# Patient Record
Sex: Male | Born: 1985
Health system: Southern US, Community
[De-identification: ages and names within clinical notes are randomized; demographics above are authoritative.]

## PROBLEM LIST (undated history)

## (undated) DIAGNOSIS — B009 Herpesviral infection, unspecified: Secondary | ICD-10-CM

## (undated) DIAGNOSIS — F319 Bipolar disorder, unspecified: Secondary | ICD-10-CM

## (undated) DIAGNOSIS — F419 Anxiety disorder, unspecified: Secondary | ICD-10-CM

## (undated) DIAGNOSIS — F1910 Other psychoactive substance abuse, uncomplicated: Secondary | ICD-10-CM

## (undated) DIAGNOSIS — F1410 Cocaine abuse, uncomplicated: Secondary | ICD-10-CM

---
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
## 2009-03-07 ENCOUNTER — Emergency Department (HOSPITAL_COMMUNITY): Admission: EM | Admit: 2009-03-07 | Discharge: 2009-03-07 | Payer: Self-pay | Admitting: Emergency Medicine

## 2009-11-03 ENCOUNTER — Emergency Department (HOSPITAL_COMMUNITY): Admission: EM | Admit: 2009-11-03 | Discharge: 2009-11-03 | Payer: Self-pay | Admitting: Emergency Medicine

## 2010-04-05 ENCOUNTER — Emergency Department (HOSPITAL_COMMUNITY)
Admission: EM | Admit: 2010-04-05 | Discharge: 2010-04-05 | Payer: Self-pay | Source: Home / Self Care | Admitting: Emergency Medicine

## 2010-06-17 LAB — BASIC METABOLIC PANEL
CO2: 24 mEq/L (ref 19–32)
Calcium: 9.9 mg/dL (ref 8.4–10.5)
Creatinine, Ser: 1.29 mg/dL (ref 0.4–1.5)
GFR calc Af Amer: >60 mL/min (ref >60)
GFR calc non Af Amer: >60 mL/min (ref >60)

## 2010-06-17 LAB — DIFFERENTIAL
Basophils Relative: 0 % (ref 0–1)
Eosinophils Absolute: 0 10*3/uL (ref 0.0–0.7)
Lymphs Abs: 2.3 10*3/uL (ref 0.7–4.0)
Neutrophils Relative %: 70 % (ref 43–77)

## 2010-06-17 LAB — URINALYSIS, ROUTINE W REFLEX MICROSCOPIC
Glucose, UA: NEGATIVE mg/dL
Hgb urine dipstick: NEGATIVE
Ketones, ur: NEGATIVE mg/dL
Protein, ur: NEGATIVE mg/dL
Urobilinogen, UA: 0.2 mg/dL (ref 0.0–1.0)

## 2010-06-17 LAB — CBC
MCH: 29.9 pg (ref 26.0–34.0)
Platelets: 220 10*3/uL (ref 150–400)
RBC: 5.18 MIL/uL (ref 4.22–5.81)
WBC: 9.9 10*3/uL (ref 4.0–10.5)

## 2010-06-17 LAB — RAPID URINE DRUG SCREEN, HOSP PERFORMED
Amphetamines: NOT DETECTED
Benzodiazepines: NOT DETECTED
Opiates: NOT DETECTED
Tetrahydrocannabinol: NOT DETECTED

## 2010-06-17 LAB — ETHANOL: Alcohol, Ethyl (B): <5 mg/dL (ref 0–10)

## 2010-07-05 LAB — RAPID URINE DRUG SCREEN, HOSP PERFORMED
Amphetamines: NOT DETECTED
Barbiturates: NOT DETECTED
Benzodiazepines: NOT DETECTED
Opiates: NOT DETECTED

## 2010-07-05 LAB — DIFFERENTIAL
Basophils Relative: 0 % (ref 0–1)
Lymphocytes Relative: 16 % (ref 12–46)
Lymphs Abs: 1.6 10*3/uL (ref 0.7–4.0)
Monocytes Absolute: 0.3 10*3/uL (ref 0.1–1.0)
Monocytes Relative: 3 % (ref 3–12)
Neutro Abs: 8.1 10*3/uL — ABNORMAL HIGH (ref 1.7–7.7)
Neutrophils Relative %: 80 % — ABNORMAL HIGH (ref 43–77)

## 2010-07-05 LAB — COMPREHENSIVE METABOLIC PANEL
Albumin: 4.2 g/dL (ref 3.5–5.2)
Alkaline Phosphatase: 44 U/L (ref 39–117)
BUN: 9 mg/dL (ref 6–23)
Calcium: 9.1 mg/dL (ref 8.4–10.5)
Creatinine, Ser: 0.99 mg/dL (ref 0.4–1.5)
Glucose, Bld: 98 mg/dL (ref 70–99)
Potassium: 3.5 mEq/L (ref 3.5–5.1)
Total Protein: 7.6 g/dL (ref 6.0–8.3)

## 2010-07-05 LAB — CBC
HCT: 44.8 % (ref 39.0–52.0)
Hemoglobin: 14.6 g/dL (ref 13.0–17.0)
MCHC: 32.6 g/dL (ref 30.0–36.0)
MCV: 90.8 fL (ref 78.0–100.0)
Platelets: 218 10*3/uL (ref 150–400)
RDW: 12.7 % (ref 11.5–15.5)

## 2010-07-05 LAB — URINALYSIS, ROUTINE W REFLEX MICROSCOPIC
Glucose, UA: NEGATIVE mg/dL
Hgb urine dipstick: NEGATIVE
Protein, ur: NEGATIVE mg/dL
Specific Gravity, Urine: 1.017 (ref 1.005–1.030)
Urobilinogen, UA: 0.2 mg/dL (ref 0.0–1.0)

## 2010-07-05 LAB — ETHANOL: Alcohol, Ethyl (B): 148 mg/dL — ABNORMAL HIGH (ref 0–10)

## 2010-08-19 NOTE — Op Note (Signed)
    [name], Kyle Sweeney                          ACCOUNT NO.:  [id number]   MEDICAL RECORD NO.:  [id number]                   PATIENT TYPE:   LOCATION:                                       FACILITY:   PHYSICIAN:  Cindee Salt, MD                      DATE OF BIRTH:  [birth year]   DATE OF PROCEDURE:  01/02/2002  DATE OF DISCHARGE:                                 OPERATIVE REPORT   PREOPERATIVE DIAGNOSIS:  Intra-articular fracture, middle phalanx, distal  interphalangeal joint, right ring finger.   POSTOPERATIVE DIAGNOSIS:  Intra-articular fracture, middle phalanx, distal  interphalangeal joint, right ring finger.   OPERATION:  Closed reduction and percutaneous pinning, intra-articular  fracture of right ring finger, middle phalanx.   SURGEON:  Cindee Salt, M.D.   ASSISTANT:  Zigmund Gottron, R.N.   ANESTHESIA:  Axillary block.   ANESTHESIOLOGIST:  Burna Forts, M.D.   INDICATIONS FOR PROCEDURE:  The patient is a 25 year old male who suffered a  fracture of his right ring finger.  It was initially nondisplaced, and  removed his splint.  Has noted displacement.  He is admitted for a closed,  and possible open reduction.   DESCRIPTION OF PROCEDURE:  The patient is brought to the operating room  where an axillary block was carried out without difficulty.  He was prepped  and draped using Betadine scrub and solution with the right arm free in a  supine position.  The fracture was manipulated, reduced with a very minimal  step-off.  A 3.5 K-wire was then inserted into the articular condyle.  This  was used to manipulate the fracture fragment into an anatomical position.  This was then crossed into the second condyle, stabilizing the fracture.  A  second wire was passed from the T-condylar portion into the shaft of the  middle phalanx from the opposite side, stabilizing the fracture in AP and  lateral directions, which was confirmed with AP and lateral image  intensification.  The  pins were bent and cut short.  A sterile compressive  dressing and splint were applied.  The patient tolerated the procedure well and was taken to the recovery room  for observation, in satisfactory condition.   DISPOSITION:  He is discharged home, to return to the [facility]  [place] in one week, on Vicodin and Keflex.                                                Cindee Salt, MD   GK/MEDQ  D:  01/02/2002  T:  01/06/2002  Job:  [id number]

## 2013-12-23 ENCOUNTER — Emergency Department (HOSPITAL_COMMUNITY)
Admission: EM | Admit: 2013-12-23 | Discharge: 2013-12-23 | Disposition: A | Discharge status: Home / Self Care | Payer: Self-pay | Attending: Emergency Medicine | Admitting: Emergency Medicine

## 2013-12-23 ENCOUNTER — Encounter (HOSPITAL_COMMUNITY): Payer: Self-pay | Admitting: Emergency Medicine

## 2013-12-23 DIAGNOSIS — K439 Ventral hernia without obstruction or gangrene: Secondary | ICD-10-CM | POA: Insufficient documentation

## 2013-12-23 DIAGNOSIS — Z79899 Other long term (current) drug therapy: Secondary | ICD-10-CM | POA: Insufficient documentation

## 2013-12-23 DIAGNOSIS — K469 Unspecified abdominal hernia without obstruction or gangrene: Secondary | ICD-10-CM | POA: Insufficient documentation

## 2013-12-23 DIAGNOSIS — Z8619 Personal history of other infectious and parasitic diseases: Secondary | ICD-10-CM | POA: Insufficient documentation

## 2013-12-23 HISTORY — DX: Herpesviral infection, unspecified: B00.9

## 2013-12-23 MED ORDER — HYDROCODONE-ACETAMINOPHEN 5-325 MG PO TABS: 1.0000 | ORAL_TABLET | ORAL | Status: DC | PRN

## 2013-12-23 NOTE — ED Notes (Signed)
Per patient mid abdominal pain since last Friday-small "bulge" above umbilicus-hurts when he lifts things-things it might be a hernia

## 2013-12-23 NOTE — ED Notes (Signed)
Pt reports abd pain, has known ventral hernia.  Pt reports pain is where his hernia is.

## 2013-12-23 NOTE — ED Provider Notes (Signed)
CSN: [id number]     Arrival date & time 12/23/13  1515 History   First MD Initiated Contact with Patient 12/23/13 1516     Chief Complaint  Patient presents with  . Hernia      HPI  Patient presents to the area of pain and a palpable bulge in his midline abdomen. He works doing Orthoptist. About a week ago he felt some pain in the area. He is noticed a small lump that comes and goes. Several fingerbreadths above his umbilicus. No nausea no vomiting. No previous incisions in this area.  Past Medical History  Diagnosis Date  . Herpes    History reviewed. No pertinent past surgical history. No family history on file. History  Substance Use Topics  . Smoking status: Never Smoker   . Smokeless tobacco: Not on file  . Alcohol Use: No    Review of Systems  Constitutional: Negative for fever, chills, diaphoresis, appetite change and fatigue.  HENT: Negative for mouth sores, sore throat and trouble swallowing.   Eyes: Negative for visual disturbance.  Respiratory: Negative for cough, chest tightness, shortness of breath and wheezing.   Cardiovascular: Negative for chest pain.  Gastrointestinal: Positive for abdominal pain. Negative for nausea, vomiting, diarrhea and abdominal distention.  Endocrine: Negative for polydipsia, polyphagia and polyuria.  Genitourinary: Negative for dysuria, frequency and hematuria.  Musculoskeletal: Negative for gait problem.  Skin: Negative for color change, pallor and rash.  Neurological: Negative for dizziness, syncope, light-headedness and headaches.  Hematological: Does not bruise/bleed easily.  Psychiatric/Behavioral: Negative for behavioral problems and confusion.      Allergies  Review of patient's allergies indicates no known allergies.  Home Medications   Prior to Admission medications   Medication Sig Start Date End Date Taking? Authorizing Provider  valACYclovir (VALTREX) 500 MG tablet Take 500 mg by mouth daily.   Yes Historical  Provider, MD  HYDROcodone-acetaminophen (NORCO/VICODIN) 5-325 MG per tablet Take 1 tablet by mouth every 4 (four) hours as needed. 12/23/13   Rolland Porter, MD   BP 150/81  Pulse 78  Temp(Src) 98.8 F (37.1 C) (Oral)  Resp 18  SpO2 100% Physical Exam  Constitutional: He is oriented to person, place, and time. He appears well-developed and well-nourished. No distress.  HENT:  Head: Normocephalic.  Eyes: Conjunctivae are normal. Pupils are equal, round, and reactive to light. No scleral icterus.  Neck: Normal range of motion. Neck supple. No thyromegaly present.  Cardiovascular: Normal rate and regular rhythm.  Exam reveals no gallop and no friction rub.   No murmur heard. Pulmonary/Chest: Effort normal and breath sounds normal. No respiratory distress. He has no wheezes. He has no rales.  Abdominal: Soft. Bowel sounds are normal. He exhibits no distension. There is no tenderness. There is no rebound.    Musculoskeletal: Normal range of motion.  Neurological: He is alert and oriented to person, place, and time.  Skin: Skin is warm and dry. No rash noted.  Psychiatric: He has a normal mood and affect. His behavior is normal.    ED Course  Procedures (including critical care time) Labs Review Labs Reviewed - No data to display  Imaging Review No results found.   EKG Interpretation None      MDM   Final diagnoses:  Ventral hernia without obstruction or gangrene    Return precautions, signs of obstruction/incarceration discussed. I encouraged use of supportive belt around her insight was lifting. Given the name and information for [facility] to call and  discuss possible elective repair.    Rolland Porter, MD 12/23/13 [phone]

## 2013-12-23 NOTE — Progress Notes (Signed)
[facility] Community Liaison Stacy,  ° °Provided pt with a list of primary care resources and a [facility] Card application to help patient establish primary care.  °

## 2013-12-23 NOTE — Discharge Instructions (Signed)
Use an elastic support belt when lifting. Call [facility] for an office appointment to discuss surgical repair.   Ventral Hernia A ventral hernia (also called an incisional hernia) is a hernia that occurs at the site of a previous surgical cut (incision) in the abdomen. The abdominal wall spans from your lower chest down to your pelvis. If the abdominal wall is weakened from a surgical incision, a hernia can occur. A hernia is a bulge of bowel or muscle tissue pushing out on the weakened part of the abdominal wall. Ventral hernias can get bigger from straining or lifting. Obese and older people are at higher risk for a ventral hernia. People who develop infections after surgery or require repeat incisions at the same site on the abdomen are also at increased risk.  Sometimes, a hernia can develop in an area of the abdomen without a prior incision. CAUSES  A ventral hernia occurs because of weakness in the abdominal wall.  SYMPTOMS  Common symptoms include:  A visible bulge or lump on the abdominal wall.  Pain or tenderness around the lump.  Increased discomfort if you cough or make a sudden movement. If the hernia has blocked part of the intestine, a serious complication can occur (incarcerated or strangulated hernia). This can become a problem that requires emergency surgery because the blood flow to the blocked intestine may be cut off. Symptoms may include:  Feeling sick to your stomach (nauseous).  Throwing up (vomiting).  Stomach swelling (distention) or bloating.  Fever.  Rapid heartbeat. DIAGNOSIS  Your health care provider will take a medical history and perform a physical exam. Various tests may be ordered, such as:  Blood tests.  Urine tests.  Ultrasonography.  X-rays.  Computed tomography (CT). TREATMENT  Watchful waiting may be all that is needed for a smaller hernia that does not cause symptoms. Your health care provider may recommend the use of a  supportive belt (truss) that helps to keep the abdominal wall intact. For larger hernias or those that cause pain, surgery to repair the hernia is usually recommended. If a hernia becomes strangulated, emergency surgery needs to be done right away. HOME CARE INSTRUCTIONS  Avoid putting pressure or strain on the abdominal area.  Avoid heavy lifting.  Use good body positioning for physical tasks. Ask your health care provider about proper body positioning.  Use a supportive belt as directed by your health care provider.  Maintain a healthy weight.  Eat foods that are high in fiber, such as whole grains, fruits, and vegetables. Fiber helps prevent difficult bowel movements (constipation).  Drink enough fluids to keep your urine clear or pale yellow.  Follow up with your health care provider as directed. SEEK MEDICAL CARE IF:   Your hernia seems to be getting larger or more painful. SEEK IMMEDIATE MEDICAL CARE IF:   You have abdominal pain that is sudden and sharp.  Your pain becomes severe.  You have repeated vomiting.  You are sweating a lot.  You notice a rapid heartbeat.  You develop a fever. MAKE SURE YOU:   Understand these instructions.  Will watch your condition.  Will get help right away if you are not doing well or get worse. Document Released: 03/06/2012 Document Revised: 08/04/2013 Document Reviewed: 03/06/2012 [facility] Patient Information 2015 [place], [place]. This information is not intended to replace advice given to you by your health care provider. Make sure you discuss any questions you have with your health care provider.

## 2017-09-01 ENCOUNTER — Encounter (HOSPITAL_COMMUNITY): Payer: Self-pay | Admitting: Oncology

## 2017-09-01 ENCOUNTER — Emergency Department (HOSPITAL_COMMUNITY): Payer: No Typology Code available for payment source

## 2017-09-01 ENCOUNTER — Inpatient Hospital Stay (HOSPITAL_COMMUNITY)
Admission: EM | Admit: 2017-09-01 | Discharge: 2017-09-04 | DRG: 178 | Disposition: A | Discharge status: Home / Self Care | Payer: No Typology Code available for payment source | Attending: Family Medicine | Admitting: Family Medicine

## 2017-09-01 DIAGNOSIS — R0902 Hypoxemia: Secondary | ICD-10-CM | POA: Diagnosis present

## 2017-09-01 DIAGNOSIS — N179 Acute kidney failure, unspecified: Secondary | ICD-10-CM | POA: Diagnosis present

## 2017-09-01 DIAGNOSIS — F1910 Other psychoactive substance abuse, uncomplicated: Secondary | ICD-10-CM

## 2017-09-01 DIAGNOSIS — F1410 Cocaine abuse, uncomplicated: Secondary | ICD-10-CM | POA: Diagnosis present

## 2017-09-01 DIAGNOSIS — J690 Pneumonitis due to inhalation of food and vomit: Secondary | ICD-10-CM | POA: Diagnosis present

## 2017-09-01 DIAGNOSIS — E872 Acidosis: Secondary | ICD-10-CM | POA: Diagnosis present

## 2017-09-01 DIAGNOSIS — F3130 Bipolar disorder, current episode depressed, mild or moderate severity, unspecified: Secondary | ICD-10-CM | POA: Diagnosis present

## 2017-09-01 LAB — BASIC METABOLIC PANEL
ANION GAP: 22 — AB (ref 5–15)
BUN: 11 mg/dL (ref 6–20)
CHLORIDE: 99 mmol/L — AB (ref 101–111)
CO2: 22 mmol/L (ref 22–32)
CREATININE: 1.91 mg/dL — AB (ref 0.61–1.24)
Calcium: 9.5 mg/dL (ref 8.9–10.3)
GFR calc Af Amer: 52 mL/min — ABNORMAL LOW (ref >60)
GFR calc non Af Amer: 45 mL/min — ABNORMAL LOW (ref >60)
Glucose, Bld: 146 mg/dL — ABNORMAL HIGH (ref 65–99)
Potassium: 4.5 mmol/L (ref 3.5–5.1)
SODIUM: 143 mmol/L (ref 135–145)

## 2017-09-01 LAB — CBC
HEMATOCRIT: 51.3 % (ref 39.0–52.0)
HEMOGLOBIN: 16.5 g/dL (ref 13.0–17.0)
MCH: 28.6 pg (ref 26.0–34.0)
MCHC: 32.2 g/dL (ref 30.0–36.0)
MCV: 88.9 fL (ref 78.0–100.0)
Platelets: 276 10*3/uL (ref 150–400)
RBC: 5.77 MIL/uL (ref 4.22–5.81)
RDW: 12.4 % (ref 11.5–15.5)
WBC: 23.6 10*3/uL — AB (ref 4.0–10.5)

## 2017-09-01 LAB — I-STAT CG4 LACTIC ACID, ED: Lactic Acid, Venous: 4.54 mmol/L (ref 0.5–1.9)

## 2017-09-01 LAB — I-STAT TROPONIN, ED: TROPONIN I, POC: 0.06 ng/mL (ref 0.00–0.08)

## 2017-09-01 MED ORDER — CLINDAMYCIN PHOSPHATE 900 MG/50ML IV SOLN
900.0000 mg | Freq: Once | INTRAVENOUS | Status: AC
  Administered 2017-09-02: 900 mg via INTRAVENOUS
  Filled 2017-09-01: qty 50

## 2017-09-01 NOTE — ED Notes (Addendum)
Patient placed back on non-re breather due to drop in sats  to 82-83%; EDP notitifed-Monique,RN

## 2017-09-01 NOTE — ED Triage Notes (Signed)
Pt bib [facility] from home.  EMS called out when pt was found unresponsive after a well check.  Per EMS pt had pin point pupils, agonal breathing requiring EMS to bag pt.  Fire gave 2 mg IN narcan. Probable aspiration.  +emesis.  Pt given 6 mg of adenosine for a HR of 160-190's.  Pt reported using what he believed to be cocaine.  Pt A&O x 4 after narcan.

## 2017-09-01 NOTE — ED Notes (Signed)
EDP notifed of critical lactic acid-Monique,RN

## 2017-09-02 ENCOUNTER — Other Ambulatory Visit: Payer: Self-pay

## 2017-09-02 ENCOUNTER — Encounter (HOSPITAL_COMMUNITY): Payer: Self-pay | Admitting: *Deleted

## 2017-09-02 DIAGNOSIS — E872 Acidosis: Secondary | ICD-10-CM | POA: Diagnosis present

## 2017-09-02 DIAGNOSIS — J690 Pneumonitis due to inhalation of food and vomit: Secondary | ICD-10-CM | POA: Diagnosis present

## 2017-09-02 DIAGNOSIS — F3130 Bipolar disorder, current episode depressed, mild or moderate severity, unspecified: Secondary | ICD-10-CM | POA: Diagnosis present

## 2017-09-02 DIAGNOSIS — R0902 Hypoxemia: Secondary | ICD-10-CM

## 2017-09-02 DIAGNOSIS — F1910 Other psychoactive substance abuse, uncomplicated: Secondary | ICD-10-CM

## 2017-09-02 DIAGNOSIS — N179 Acute kidney failure, unspecified: Secondary | ICD-10-CM | POA: Diagnosis present

## 2017-09-02 DIAGNOSIS — F1410 Cocaine abuse, uncomplicated: Secondary | ICD-10-CM | POA: Diagnosis present

## 2017-09-02 LAB — I-STAT CG4 LACTIC ACID, ED: LACTIC ACID, VENOUS: 2.38 mmol/L — AB (ref 0.5–1.9)

## 2017-09-02 LAB — CK: CK TOTAL: 1272 U/L — AB (ref 49–397)

## 2017-09-02 LAB — CBC
HCT: 50.3 % (ref 39.0–52.0)
Hemoglobin: 16.2 g/dL (ref 13.0–17.0)
MCH: 28.7 pg (ref 26.0–34.0)
MCHC: 32.2 g/dL (ref 30.0–36.0)
MCV: 89.2 fL (ref 78.0–100.0)
PLATELETS: 221 10*3/uL (ref 150–400)
RBC: 5.64 MIL/uL (ref 4.22–5.81)
RDW: 12.2 % (ref 11.5–15.5)
WBC: 24.2 10*3/uL — AB (ref 4.0–10.5)

## 2017-09-02 LAB — BASIC METABOLIC PANEL
Anion gap: 15 (ref 5–15)
BUN: 14 mg/dL (ref 6–20)
CALCIUM: 9.2 mg/dL (ref 8.9–10.3)
CO2: 22 mmol/L (ref 22–32)
CREATININE: 1.42 mg/dL — AB (ref 0.61–1.24)
Chloride: 100 mmol/L — ABNORMAL LOW (ref 101–111)
GFR calc non Af Amer: >60 mL/min (ref >60)
Glucose, Bld: 93 mg/dL (ref 65–99)
Potassium: 4.3 mmol/L (ref 3.5–5.1)
Sodium: 137 mmol/L (ref 135–145)

## 2017-09-02 LAB — RAPID URINE DRUG SCREEN, HOSP PERFORMED
Amphetamines: NOT DETECTED
BENZODIAZEPINES: NOT DETECTED
Barbiturates: NOT DETECTED
COCAINE: POSITIVE — AB
Opiates: NOT DETECTED
Tetrahydrocannabinol: POSITIVE — AB

## 2017-09-02 LAB — MRSA PCR SCREENING: MRSA by PCR: NEGATIVE

## 2017-09-02 LAB — LACTIC ACID, PLASMA: Lactic Acid, Venous: 0.9 mmol/L (ref 0.5–1.9)

## 2017-09-02 LAB — HIV ANTIBODY (ROUTINE TESTING W REFLEX): HIV Screen 4th Generation wRfx: NONREACTIVE

## 2017-09-02 LAB — ETHANOL

## 2017-09-02 MED ORDER — LORAZEPAM 2 MG/ML IJ SOLN
1.0000 mg | INTRAMUSCULAR | Status: DC | PRN
  Administered 2017-09-02: 1 mg via INTRAVENOUS
  Filled 2017-09-02: qty 1

## 2017-09-02 MED ORDER — ONDANSETRON HCL 4 MG PO TABS: 4.0000 mg | ORAL_TABLET | Freq: Four times a day (QID) | ORAL | Status: DC | PRN

## 2017-09-02 MED ORDER — LORAZEPAM 2 MG/ML IJ SOLN: 1.0000 mg | Freq: Four times a day (QID) | INTRAMUSCULAR | Status: DC | PRN

## 2017-09-02 MED ORDER — THIAMINE HCL 100 MG/ML IJ SOLN: 100.0000 mg | Freq: Every day | INTRAMUSCULAR | Status: DC

## 2017-09-02 MED ORDER — FOLIC ACID 1 MG PO TABS
1.0000 mg | ORAL_TABLET | Freq: Every day | ORAL | Status: DC
Start: 2017-09-02 — End: 2017-09-04
  Administered 2017-09-02 – 2017-09-04 (×3): 1 mg via ORAL
  Filled 2017-09-02 (×3): qty 1

## 2017-09-02 MED ORDER — SODIUM CHLORIDE 0.9 % IV SOLN
INTRAVENOUS | Status: DC
  Administered 2017-09-02: 1000 mL via INTRAVENOUS
  Administered 2017-09-02 – 2017-09-04 (×4): via INTRAVENOUS

## 2017-09-02 MED ORDER — CLINDAMYCIN PHOSPHATE 900 MG/50ML IV SOLN
900.0000 mg | Freq: Three times a day (TID) | INTRAVENOUS | Status: DC
  Administered 2017-09-02 – 2017-09-03 (×4): 900 mg via INTRAVENOUS
  Filled 2017-09-02 (×5): qty 50

## 2017-09-02 MED ORDER — ACETAMINOPHEN 650 MG RE SUPP: 650.0000 mg | Freq: Four times a day (QID) | RECTAL | Status: DC | PRN

## 2017-09-02 MED ORDER — ACETAMINOPHEN 325 MG PO TABS
650.0000 mg | ORAL_TABLET | Freq: Four times a day (QID) | ORAL | Status: DC | PRN
  Administered 2017-09-02 – 2017-09-03 (×2): 650 mg via ORAL
  Filled 2017-09-02 (×2): qty 2

## 2017-09-02 MED ORDER — ADULT MULTIVITAMIN W/MINERALS CH
1.0000 | ORAL_TABLET | Freq: Every day | ORAL | Status: DC
  Administered 2017-09-02 – 2017-09-04 (×3): 1 via ORAL
  Filled 2017-09-02 (×3): qty 1

## 2017-09-02 MED ORDER — LORAZEPAM 1 MG PO TABS: 1.0000 mg | ORAL_TABLET | Freq: Four times a day (QID) | ORAL | Status: DC | PRN

## 2017-09-02 MED ORDER — ENOXAPARIN SODIUM 40 MG/0.4ML ~~LOC~~ SOLN
40.0000 mg | SUBCUTANEOUS | Status: DC
  Administered 2017-09-02 – 2017-09-03 (×2): 40 mg via SUBCUTANEOUS
  Filled 2017-09-02 (×2): qty 0.4

## 2017-09-02 MED ORDER — VITAMIN B-1 100 MG PO TABS
100.0000 mg | ORAL_TABLET | Freq: Every day | ORAL | Status: DC
  Administered 2017-09-02 – 2017-09-04 (×3): 100 mg via ORAL
  Filled 2017-09-02 (×4): qty 1

## 2017-09-02 MED ORDER — ONDANSETRON HCL 4 MG/2ML IJ SOLN: 4.0000 mg | Freq: Four times a day (QID) | INTRAMUSCULAR | Status: DC | PRN

## 2017-09-02 NOTE — ED Notes (Signed)
Patient and family requesting to be made XXX; Registration notifed-Monique,RN

## 2017-09-02 NOTE — ED Notes (Signed)
NT Kyle Sweeney currently drawing labs.

## 2017-09-02 NOTE — Progress Notes (Signed)
FPTS Interim Progress Note  S: Patient a bit sleepy this morning but reports that breathing has improved and is currently on [place]. Does not endorses much of an appetite.   O: BP 112/65   Pulse 95   Temp 99.2 F (37.3 C) (Oral)   Resp 15   Ht 5\' 10"  (1.778 m)   Wt 175 lb (79.4 kg)   SpO2 95%   BMI 25.11 kg/m     A/P: Patient appears to exhibit some signs of withdrawal this morning while our attending was rounding on him with noticeable shaking/tremors. Did not receive any calls about him overnight. Given history of substance use, will place him on CIWA protocol. Patient also received 1 mg ativan this morning. Will transfer patient to telemetry with low threshold to send them to SDU if symptoms worsens. He is currently receiving IVF and has been placed on [place] and maintaining appropriate O2 sat.  Lovena Neighboursiallo, Wynn Alldredge, MD 09/02/2017, 9:46 AM PGY-2, [facility] pager [phone]

## 2017-09-02 NOTE — ED Notes (Signed)
Patient's family member given recliner for comfort while waiting for room-Monique,RN

## 2017-09-02 NOTE — Progress Notes (Signed)
09/02/2017 Patient came from the emergency room to 2W20 at 1730. He is alert, orient and ambulatory. Patient feet dry, have tattoos on arms, back and chest. Patient receive MRSA swab, telemetry was place and central monitoring was notify [facility] Anwar Crill RN.

## 2017-09-02 NOTE — ED Notes (Addendum)
Pt arrived to F5 via stretcher. Pt alert, oriented, calm, cooperative. Significant other at bedside. O2 infusing at 2L/min via n/c. Monitor applied to pt. Lunch tray ordered for pt.

## 2017-09-02 NOTE — H&P (Signed)
Family Medicine Teaching [facility] Admission History and Physical Service Pager: [phone]  Patient name: Kyle Sweeney Medical record number: [id number] Date of birth: Apr 21, 1985 Age: 32 y.o. Gender: male  Primary Care Provider: [facility] Consultants: None Code Status: Full   Chief Complaint: AMS and SOB  Assessment and Plan: Kyle Sweeney is a 32 y.o. male with a past medical history significant for bipolar disorder who presents with shortness of breath in the setting of aspiration pneumonia secondary to substance ingestion ingestion requiring Narcan.  #Shortness of breath in the setting of aspiration pneumonitis, acute Patient presents with worsening shortness of breath after experiencing aspiration events in route to the ED.  On admission O2 sats in mid 80s with no improvement on nasal cannula.  Patient requiring nonrebreather mask to maintain oxygen saturation above 90%.  Chest x-ray showed extensive right lower lobe consolidation consistent with aspiration pneumonitis.  WBC elevated at 23.6, LA 4.54.  Patient started on clindamycin.  We will continue oxygen therapy as needed. --Admit to FMTS, admitting physician Dr.Eniola --Admit to MedSurg --Oxygen therapy, wean as tolerated --Continue clindamycin 900 mg every 8 --Follow-up on blood cultures --Follow-up on a.m. CBC and BMP --Acetaminophen 650 mg every 6 as needed --Zofran 4 mg every 6 as needed  #AKI Patient presented with creatinine of 1.91.  Likely secondary to hypoperfusion in the setting of unresponsiveness status post substance ingestion.  Unclear duration.  We will continue to monitor.  Expect improvement with gentle IV fluid.  Could also have an element of rhabdomyolysis. --Normal saline 125 cc/hr --Follow-up on a.m.BMP --Follow-up on CK  #Anion gap metabolic acidosis Patient presented with LA 4.54 with anion gap of 22.  Patient was found unresponsive at home requiring Narcan.   Unclear how long patient was down.  Likely elevated lactate secondary to decreased perfusion.  Could also be secondary to ingestion of unknown substance.  Patient does also have elevated WBC consistent with aspiration pneumonitis.  Bicarb is 22. --Gentle IV fluid, normal saline 125 cc/hr --Trend lactate --Follow-up on a.m. BMP  #Bipolar disorder Patient reports being on Paxil for the past year and Seroquel for the last 6 months for bipolar disorder.  [facility]  Unclear on Paxil dose.  Will hold Paxil and Seroquel until patient respiratory and mental status improved --Resume Paxil and Seroquel when clinically appropriate   FEN/GI: Regular diet Prophylaxis: Lovenox  Disposition: Home pending clinical improvement  History of Present Illness:  Kyle Sweeney is a 32 y.o. male past medical history significant for bipolar disorder who presents today with increased shortness of breath after he was found unresponsive at home in the setting of substance ingestion.  Patient reports that he just started a substance that he thought was cocaine.  Patient became unresponsive but is unclear of how long he was down.  EMS arrived and noted patient to have pinpoint pupils and unresponsive. Narcan was administered. Patient become responsive with some signs of tachycardia with heart rate in the 160s requiring adenosine.  Upon adenosine injection patient had an episode of emesis with aspiration.  Patient unable to maintain oxygen saturation above 80% despite nasal cannula. On admission to the ED patient was transitioned from nasal cannula to room with a mask.  Chest x-ray showed significant right lower lobe pneumonitis consistent with aspiration.  Patient had leukocytosis with WBC 23.6 as well as an elevated lactate 4.54 and anion gap 22..  Patient was started on clindamycin Family medicine teaching  service was consulted for admission.  Review Of Systems: Per HPI with the  following additions:  Review of Systems  Constitutional: Negative.   HENT: Negative.   Eyes: Negative.   Respiratory: Positive for shortness of breath.   Cardiovascular: Positive for palpitations.  Gastrointestinal: Negative.   Genitourinary: Negative.   Musculoskeletal: Negative.   Skin: Negative.   Neurological: Positive for loss of consciousness.  Endo/Heme/Allergies: Negative.   Psychiatric/Behavioral: Positive for substance abuse.    Patient Active Problem List   Diagnosis Date Noted  . Aspiration pneumonia of right middle lobe due to vomit (HCC)   . Hypoxia   . Polysubstance abuse [facility](HCC)     Past Medical History: Past Medical History:  Diagnosis Date  . Herpes     Past Surgical History: History reviewed. No pertinent surgical history.  Social History: Social History   Tobacco Use  . Smoking status: Never Smoker  Substance Use Topics  . Alcohol use: No  . Drug use: No   Additional social history:  Please also refer to relevant sections of EMR.  Family History: No family history on file. (If not completed, MUST add something in)  Allergies and Medications: No Known Allergies No current facility-administered medications on file prior to encounter.    Current Outpatient Medications on File Prior to Encounter  Medication Sig Dispense Refill  . PARoxetine HCl (PAXIL PO) Take by mouth daily.    . QUEtiapine (SEROQUEL) 50 MG tablet Take 50 mg by mouth every morning.   3  . valACYclovir (VALTREX) 500 MG tablet Take 500 mg by mouth daily.      Objective: BP 106/66   Pulse 100   Resp 19   SpO2 91%  Physical Exam: General: NAD,, able to participate in exam HEENT: Pinpoint pupils, able to track Cardiac: RRR, normal heart sounds, no murmurs. 2+ radial and PT pulses bilaterally Respiratory: Decreased breath sounds right middle lobe, No wheezes, rales or rhonchi Abdomen: soft, nontender, nondistended, no hepatic or splenomegaly, +BS Extremities: no edema or  cyanosis. WWP. Skin: warm and dry, no rashes noted Neuro: alert and oriented x4, no focal deficits Psych: Normal affect and mood  Labs and Imaging: CBC BMET  Recent Labs  Lab 09/01/17 2344  WBC 23.6*  HGB 16.5  HCT 51.3  PLT 276   Recent Labs  Lab 09/01/17 2230  NA 143  K 4.5  CL 99*  CO2 22  BUN 11  CREATININE 1.91*  GLUCOSE 146*  CALCIUM 9.5     Troponin:0.06 CXR: IMPRESSION: 1. Extensive right lower lobe consolidation, likely reflective of aspiration pneumonia/pneumonitis. Electronically Signed   By: Trudie Reedaniel  Entrikin M.D.   On: 09/01/2017 22:50   Lovena Neighboursiallo, Asiya Cutbirth, MD 09/02/2017, 12:54 AM PGY-2, [facility] FPTS Intern pager: [phone], text pages welcome

## 2017-09-02 NOTE — ED Notes (Signed)
Admitting at bedside-Monique,RN  

## 2017-09-02 NOTE — ED Provider Notes (Signed)
[facility] EMERGENCY DEPARTMENT Provider Note   CSN: [id number] Arrival date & time: 09/01/17  2211     History   Chief Complaint Chief Complaint  Patient presents with  . Ingestion    HPI Kyle Sweeney is a 32 y.o. male.  32 yo M with a chief complaint of a drug ingestion.  Patient thinks that he was doing cocaine and then woke up with EMS standing over him.  Patient does not provide much further history.  Per EMS the patient was in a fast heart rhythm that they thought might of been SVT was given  Adenosine with what looks more like atrial fibrillation.  This then converted to sinus tachycardia.  He also had an aspiration event.  The history is provided by the patient.  Ingestion  This is a new problem. The current episode started 1 to 2 hours ago. The problem occurs constantly. The problem has not changed since onset.Associated symptoms include shortness of breath. Pertinent negatives include no chest pain, no abdominal pain and no headaches. Nothing aggravates the symptoms. Nothing relieves the symptoms. He has tried nothing for the symptoms. The treatment provided no relief.    Past Medical History:  Diagnosis Date  . Herpes     There are no active problems to display for this patient.   History reviewed. No pertinent surgical history.      Home Medications    Prior to Admission medications   Medication Sig Start Date End Date Taking? Authorizing Provider  PARoxetine HCl (PAXIL PO) Take by mouth daily.   Yes [provider]  QUEtiapine (SEROQUEL) 50 MG tablet Take 50 mg by mouth every morning.  08/01/17  Yes [provider]  valACYclovir (VALTREX) 500 MG tablet Take 500 mg by mouth daily.   Yes [provider]    Family History No family history on file.  Social History Social History   Tobacco Use  . Smoking status: Never Smoker  Substance Use Topics  . Alcohol use: No  . Drug use: No     Allergies     Patient has no known allergies.   Review of Systems Review of Systems  Constitutional: Negative for chills and fever.  HENT: Negative for congestion and facial swelling.   Eyes: Negative for discharge and visual disturbance.  Respiratory: Positive for shortness of breath.   Cardiovascular: Negative for chest pain and palpitations.  Gastrointestinal: Negative for abdominal pain, diarrhea and vomiting.  Musculoskeletal: Negative for arthralgias and myalgias.  Skin: Negative for color change and rash.  Neurological: Negative for tremors, syncope and headaches.  Psychiatric/Behavioral: Negative for confusion and dysphoric mood.     Physical Exam Updated Vital Signs BP 118/85   Pulse (!) 107   Resp 17   SpO2 95%   Physical Exam  Constitutional: He is oriented to person, place, and time. He appears well-developed and well-nourished.  HENT:  Head: Normocephalic and atraumatic.  Eyes: Pupils are equal, round, and reactive to light. EOM are normal.  Neck: Normal range of motion. Neck supple. No JVD present.  Cardiovascular: Regular rhythm. Tachycardia present. Exam reveals no gallop and no friction rub.  No murmur heard. Pulmonary/Chest: No respiratory distress. He has no wheezes.  Abdominal: He exhibits no distension and no mass. There is no tenderness. There is no rebound and no guarding.  Musculoskeletal: Normal range of motion.  Neurological: He is alert and oriented to person, place, and time.  Skin: No rash noted. No pallor.  Psychiatric: He has a normal mood and affect. His behavior is normal.  Nursing note and vitals reviewed.    ED Treatments / Results  Labs (all labs ordered are listed, but only abnormal results are displayed) Labs Reviewed  BASIC METABOLIC PANEL - Abnormal; Notable for the following components:      Result Value   Chloride 99 (*)    Glucose, Bld 146 (*)    Creatinine, Ser 1.91 (*)    GFR calc non Af Amer 45 (*)    GFR calc Af Amer 52 (*)     Anion gap 22 (*)    All other components within normal limits  CBC - Abnormal; Notable for the following components:   WBC 23.6 (*)    All other components within normal limits  I-STAT CG4 LACTIC ACID, ED - Abnormal; Notable for the following components:   Lactic Acid, Venous 4.54 (*)    All other components within normal limits  CULTURE, BLOOD (ROUTINE X 2)  CULTURE, BLOOD (ROUTINE X 2)  CBC WITH DIFFERENTIAL/PLATELET  I-STAT TROPONIN, ED  I-STAT CHEM 8, ED    EKG EKG Interpretation  Date/Time:  Saturday September 01 2017 22:18:31 EDT Ventricular Rate:  115 PR Interval:    QRS Duration: 95 QT Interval:  323 QTC Calculation: 447 R Axis:   -120 Text Interpretation:  Sinus tachycardia Atrial premature complex Biatrial enlargement LAD, consider left anterior fascicular block Right ventricular hypertrophy ST elevation, consider anterolateral injury No old tracing to compare Confirmed by Melene Plan [phone]) on 09/01/2017 11:11:09 PM   Radiology Dg Chest Port 1 View  Result Date: 09/01/2017 CLINICAL DATA:  32 year old male found unresponsive. Probable aspiration. EXAM: PORTABLE CHEST 1 VIEW COMPARISON:  Chest x-ray 03/07/2009. FINDINGS: Extensive airspace consolidation in the right lower lobe. Left lung is clear. No pleural effusions. No evidence of pulmonary edema. Heart size is normal. Upper mediastinal contours are within normal limits. IMPRESSION: 1. Extensive right lower lobe consolidation, likely reflective of aspiration pneumonia/pneumonitis. Electronically Signed   By: Trudie Reed M.D.   On: 09/01/2017 22:50    Procedures Procedures (including critical care time)  Medications Ordered in ED Medications  clindamycin (CLEOCIN) IVPB 900 mg (has no administration in time range)     Initial Impression / Assessment and Plan / ED Course  I have reviewed the triage vital signs and the nursing notes.  Pertinent labs & imaging results that were available during my care of the patient  were reviewed by me and considered in my medical decision making (see chart for details).     32 yo M with a chief complaint of altered mental status.  EMS was called to the scene and found him unresponsive.  He was given Narcan with improvement but went into a fast irregular rhythm given a [place] with improvement into sinus tachycardia, however had an aspiration event in route.  Patient requiring oxygen here as much as a nonrebreather.  Lactate 4-1/2 white blood cell count 23 patient has an anion gap of 22 which is likely a lactic acidosis.  Given clindamycin.  Chest x-ray concerning for a right middle lobe infiltrate by my view.  Admit.  CRITICAL CARE Performed by: Rae Roam   Total critical care time: 80 minutes  Critical care time was exclusive of separately billable procedures and treating other patients.  Critical care was necessary to treat or prevent imminent or life-threatening deterioration.  Critical care was time spent personally by me on the following activities: development  of treatment plan with patient and/or surrogate as well as nursing, discussions with consultants, evaluation of patient's response to treatment, examination of patient, obtaining history from patient or surrogate, ordering and performing treatments and interventions, ordering and review of laboratory studies, ordering and review of radiographic studies, pulse oximetry and re-evaluation of patient's condition.  The patients results and plan were reviewed and discussed.   Any x-rays performed were independently reviewed by myself.   Differential diagnosis were considered with the presenting HPI.  Medications  clindamycin (CLEOCIN) IVPB 900 mg (has no administration in time range)    Vitals:   09/01/17 2300 09/01/17 2315 09/01/17 2330 09/01/17 2345  BP: 136/86 139/88 131/82 118/85  Pulse: (!) 107 (!) 124 (!) 109 (!) 107  Resp: 17 (!) 30 20 17   SpO2: (!) 85% (!) 83% 95% 95%    Final  diagnoses:  Aspiration pneumonia of right middle lobe due to vomit (HCC)  Hypoxia  Polysubstance abuse (HCC)    Admission/ observation were discussed with the admitting physician, patient and/or family and they are comfortable with the plan.    Final Clinical Impressions(s) / ED Diagnoses   Final diagnoses:  Aspiration pneumonia of right middle lobe due to vomit [facility](HCC)  Hypoxia  Polysubstance abuse [facility](HCC)    ED Discharge Orders    None       Melene PlanFloyd, Sylar Voong, DO 09/02/17 0005

## 2017-09-02 NOTE — ED Notes (Signed)
Pt lying on bed watching tv. Ate lunch w/o difficulty. Significant other remains at bedside.

## 2017-09-03 LAB — CBC
HCT: 42.8 % (ref 39.0–52.0)
Hemoglobin: 13.7 g/dL (ref 13.0–17.0)
MCH: 28.6 pg (ref 26.0–34.0)
MCHC: 32 g/dL (ref 30.0–36.0)
MCV: 89.4 fL (ref 78.0–100.0)
PLATELETS: 175 10*3/uL (ref 150–400)
RBC: 4.79 MIL/uL (ref 4.22–5.81)
RDW: 12.1 % (ref 11.5–15.5)
WBC: 12.8 10*3/uL — AB (ref 4.0–10.5)

## 2017-09-03 LAB — BASIC METABOLIC PANEL
Anion gap: 5 (ref 5–15)
BUN: 8 mg/dL (ref 6–20)
CALCIUM: 8.8 mg/dL — AB (ref 8.9–10.3)
CO2: 29 mmol/L (ref 22–32)
Chloride: 106 mmol/L (ref 101–111)
Creatinine, Ser: 1.3 mg/dL — ABNORMAL HIGH (ref 0.61–1.24)
GFR calc Af Amer: >60 mL/min (ref >60)
GLUCOSE: 103 mg/dL — AB (ref 65–99)
Potassium: 3.7 mmol/L (ref 3.5–5.1)
Sodium: 140 mmol/L (ref 135–145)

## 2017-09-03 LAB — CK: CK TOTAL: 1059 U/L — AB (ref 49–397)

## 2017-09-03 MED ORDER — AMOXICILLIN-POT CLAVULANATE 875-125 MG PO TABS
1.0000 | ORAL_TABLET | Freq: Two times a day (BID) | ORAL | Status: DC
  Administered 2017-09-03 – 2017-09-04 (×3): 1 via ORAL
  Filled 2017-09-03 (×3): qty 1

## 2017-09-03 MED ORDER — QUETIAPINE FUMARATE 25 MG PO TABS
50.0000 mg | ORAL_TABLET | Freq: Every day | ORAL | Status: DC
  Administered 2017-09-03: 50 mg via ORAL
  Filled 2017-09-03: qty 2

## 2017-09-03 MED ORDER — PAROXETINE HCL 20 MG PO TABS
20.0000 mg | ORAL_TABLET | Freq: Every day | ORAL | Status: DC
  Administered 2017-09-03 – 2017-09-04 (×2): 20 mg via ORAL
  Filled 2017-09-03 (×2): qty 1

## 2017-09-03 NOTE — Progress Notes (Signed)
Family Medicine Teaching Service Daily Progress Note Intern Pager: [phone]  Patient name: Kyle Sweeney Medical record number: [id number] Date of birth: Feb 11, 1986 Age: 32 y.o. Gender: male  Primary Care Provider: [facility] Consultants: None Code Status: Full  Pt Overview and Major Events to Date:  Kyle Sweeney is a 32 y.o. male with a past medical history significant for bipolar disorder who presents with shortness of breath in the setting of aspiration pneumonia secondary to substance ingestion ingestion requiring Narcan.  Assessment and Plan:  Shortness of breath in the setting of aspiration pneumonitis, acute Patient improving now on room air with SpO2 >92% and afebrile for past 24 hours. WBC down trending. Chest x-ray showed extensive right lower lobe consolidation consistent with aspiration pneumonitis.  Patient started on clindamycin now on oral Augmentin.   --Cont pulse ox check with vitals --D/c Clindamycin (6/1-3); started Augmentin 875mg  BID (6/3 -) for total of 7 days --Follow-up on blood cultures pending --Follow-up on a.m. CBC --Acetaminophen 650 mg every 6 as needed --Zofran 4 mg every 6 as needed  AKI Improved. Scr of 1.30 appears to be baseline as patient had previous admission in 2011 with Scr of 1.29. Likely secondary to hypoperfusion in the setting of unresponsiveness status post substance ingestion. We will continue to monitor.  --Cont Normal saline 125 cc/hr, monitor oral intake and d/c fluids if po is adequate --Repeat BMP in am  Anion gap metabolic acidosis Resolved. Patient presented with LA 4.54 with anion gap of 22 now resolved with LA normal at 0.9 and AG of 5.   --Gentle IV fluid, normal saline 125 cc/hr; once po intake is adequate d/c fluids  #Bipolar disorder Patient reports being on Paxil for the past year and Seroquel for the last 6 months for bipolar disorder.  [facility].   Unclear on Paxil dose.  Will hold Paxil and Seroquel until patient respiratory and mental status improved --Resume Paxil and Seroquel   Polysubstance Abuse UDS was positive for cocaine and tetrahydrocannabinol on admission and patient endorses using these  --social work consult for community resources and rehab programs for polysubstance abuse  FEN/GI: Reg diet PPx: Lovenox  Disposition: Home pending improvement  Subjective:  Patient today is feeling better. He is sitting up in bed and in responsive and appropriate. He states he would like resources for rehab programs and community   He denies chest pain, SOB, difficulty breathing, nausea, vomiting.  Objective: Temp:  [98.5 F (36.9 C)-99.7 F (37.6 C)] 98.5 F (36.9 C) (06/02 2336) Pulse Rate:  [73-96] 73 (06/02 2336) Resp:  [11-25] 19 (06/02 2336) BP: (108-130)/(62-87) 127/87 (06/02 2336) SpO2:  [92 %-100 %] 100 % (06/02 2336) Weight:  [165 lb 11.2 oz (75.2 kg)-175 lb (79.4 kg)] 165 lb 11.2 oz (75.2 kg) (06/03 0538) Physical Exam:  Gen: Alert and Oriented x 3, NAD HEENT: Normocephalic, atraumatic, PERRLA, EOMI CV: RRR, no murmurs, normal S1, S2 split, +2 pulses dorsalis pedis bilaterally Resp: CTAB, no wheezing, rales, or rhonchi, comfortable work of breathing Abd: non-distended, non-tender, soft, +bs in all four quadrants MSK: Moves in all four extremities Ext: no clubbing, cyanosis, or edema Skin: warm, dry, intact, no rashes  Laboratory: Recent Labs  Lab 09/01/17 2344 09/02/17 0213 09/03/17 0340  WBC 23.6* 24.2* 12.8*  HGB 16.5 16.2 13.7  HCT 51.3 50.3 42.8  PLT 276 221 175   Recent Labs  Lab 09/01/17 2230 09/02/17 0213 09/03/17 0340  NA 143  137 140  K 4.5 4.3 3.7  CL 99* 100* 106  CO2 22 22 29   BUN 11 14 8   CREATININE 1.91* 1.42* 1.30*  CALCIUM 9.5 9.2 8.8*  GLUCOSE 146* 93 103*   6/2 - CK: 1,272 6/2 - Troponin: 0.06 6/2 - Lactic Acid 0.9  Imaging/Diagnostic Tests: CXR: IMPRESSION: 1.  Extensive right lower lobe consolidation, likely reflective of aspiration pneumonia/pneumonitis.   Arlyce Harman, DO 09/03/2017, 6:34 AM PGY-1, [facility] FPTS Intern pager: [phone], text pages welcome

## 2017-09-03 NOTE — Care Management Note (Signed)
Case Management Note  Patient Details  Name: Kyle Sweeney MRN: [id number] Date of Birth: 1986-02-07  Subjective/Objective:   From home, pta indep, presents with asp pna, drug ingestion, he states he does have a PCP which is [facility] [place] and he has insurance, he states he does not need assistance in getting medications.  He will have his girlfriend bring his insurance card tomorrow.  He does not want NCM to call employer , because he does not want them to know why he is here or that he is here.                  Action/Plan: DC home when ready.  Expected Discharge Date:  09/04/17               Expected Discharge Plan:  Home/Self Care  In-House Referral:     Discharge planning Services  CM Consult  Post Acute Care Choice:    Choice offered to:     DME Arranged:    DME Agency:     HH Arranged:    HH Agency:     Status of Service:  Completed, signed off  If discussed at [employer] Length of Stay Meetings, dates discussed:    Additional Comments:  Leone Havenaylor, Shabana Armentrout [place], RN 09/03/2017, 12:22 PM

## 2017-09-04 LAB — CBC
HEMATOCRIT: 41.3 % (ref 39.0–52.0)
Hemoglobin: 13.4 g/dL (ref 13.0–17.0)
MCH: 28.9 pg (ref 26.0–34.0)
MCHC: 32.4 g/dL (ref 30.0–36.0)
MCV: 89.2 fL (ref 78.0–100.0)
Platelets: 174 10*3/uL (ref 150–400)
RBC: 4.63 MIL/uL (ref 4.22–5.81)
RDW: 12.2 % (ref 11.5–15.5)
WBC: 10.2 10*3/uL (ref 4.0–10.5)

## 2017-09-04 LAB — BASIC METABOLIC PANEL
ANION GAP: 5 (ref 5–15)
BUN: 8 mg/dL (ref 6–20)
CHLORIDE: 107 mmol/L (ref 101–111)
CO2: 28 mmol/L (ref 22–32)
Calcium: 8.8 mg/dL — ABNORMAL LOW (ref 8.9–10.3)
Creatinine, Ser: 1.36 mg/dL — ABNORMAL HIGH (ref 0.61–1.24)
GFR calc non Af Amer: >60 mL/min (ref >60)
Glucose, Bld: 94 mg/dL (ref 65–99)
POTASSIUM: 3.8 mmol/L (ref 3.5–5.1)
SODIUM: 140 mmol/L (ref 135–145)

## 2017-09-04 MED ORDER — THIAMINE HCL 100 MG PO TABS: 100.0000 mg | ORAL_TABLET | Freq: Every day | ORAL | 1 refills | Status: DC

## 2017-09-04 MED ORDER — FOLIC ACID 1 MG PO TABS: 1.0000 mg | ORAL_TABLET | Freq: Every day | ORAL | 0 refills | Status: DC

## 2017-09-04 MED ORDER — AMOXICILLIN-POT CLAVULANATE 875-125 MG PO TABS: 1.0000 | ORAL_TABLET | Freq: Two times a day (BID) | ORAL | 0 refills | Status: AC

## 2017-09-04 NOTE — Discharge Instructions (Signed)
Aspiration Pneumonia °Aspiration pneumonia is an infection in your lungs. It occurs when food, liquid, or stomach contents (vomit) are inhaled (aspirated) into your lungs. When these things get into your lungs, swelling (inflammation) and infection can occur. This can make it difficult for you to breathe. Aspiration pneumonia is a serious condition and can be life threatening. °What increases the risk? °Aspiration pneumonia is more likely to occur when a person's cough (gag) reflex or ability to swallow has been decreased. Some things that can do this include: °· Having a brain injury or disease, such as stroke, seizures, Parkinson's disease, dementia, or amyotrophic lateral sclerosis (ALS). °· Being given general anesthetic for procedures. °· Being in a coma (unconscious). °· Having a narrowing of the tube that carries food to the stomach (esophagus). °· Drinking too much alcohol. If a person passes out and vomits, vomit can be swallowed into the lungs. °· Taking certain medicines, such as tranquilizers or sedatives. ° °What are the signs or symptoms? °· Coughing after swallowing food or liquids. °· Breathing problems, such as wheezing or shortness of breath. °· Bluish skin. This can be caused by lack of oxygen. °· Coughing up food or mucus. The mucus might contain blood, greenish material, or yellowish-white fluid (pus). °· Fever. °· Chest pain. °· Being more tired than usual (fatigue). °· Sweating more than usual. °· Bad breath. °How is this diagnosed? °A physical exam will be done. During the exam, the health care provider will listen to your lungs with a stethoscope to check for: °· Crackling sounds in the lungs. °· Decreased breath sounds. °· A rapid heartbeat. ° °Various tests may be ordered. These may include: °· Chest X-ray. °· CT scan. °· Swallowing study. This test looks at how food is swallowed and whether it goes into your breathing tube (trachea) or food pipe (esophagus). °· Sputum culture. Saliva and  mucus (sputum) are collected from the lungs or the tubes that carry air to the lungs (bronchi). The sputum is then tested for bacteria. °· Bronchoscopy. This test uses a flexible tube (bronchoscope) to see inside the lungs. ° °How is this treated? °Treatment will usually include antibiotic medicines. Other medicines may also be used to reduce fever or pain. You may need to be treated in the hospital. In the hospital, your breathing will be carefully monitored. Depending on how well you are breathing, you may need to be given oxygen, or you may need breathing support from a breathing machine (ventilator). For people who fail a swallowing study, a feeding tube might be placed in the stomach, or they may be asked to avoid certain food textures or liquids when they eat. °Follow these instructions at home: °· Carefully follow any special eating instructions you were given, such as avoiding certain food textures or thickening liquids. This reduces the risk of developing aspiration pneumonia again. °· Only take over-the-counter or prescription medicines as directed by your health care provider. Follow the directions carefully. °· If you were prescribed antibiotics, take them as directed. Finish them even if you start to feel better. °· Rest as instructed by your health care provider. °· Keep all follow-up appointments with your health care provider. °Contact a health care provider if: °· You develop worsening shortness of breath, wheezing, or difficulty breathing. °· You develop a fever. °· You have chest pain. °This information is not intended to replace advice given to you by your health care provider. Make sure you discuss any questions you have with your health care   provider. °Document Released: 01/15/2009 Document Revised: 09/01/2015 Document Reviewed: 09/05/2012 °Elsevier Interactive Patient Education © 2017 Elsevier Inc. ° °

## 2017-09-04 NOTE — Discharge Summary (Signed)
Family Medicine Teaching [facility] Discharge Summary  Patient name: Kyle Sweeney Medical record number: [id number] Date of birth: May 11, 1985 Age: 32 y.o. Gender: male Date of Admission: 09/01/2017  Date of Discharge: 09/04/2017 Admitting Physician: Leland HerElsia J Yoo, DO  Primary Care Provider: [facility] Consultants: None  Indication for Hospitalization:   Aspiration PNA  Discharge Diagnoses/Problem List:   Aspiration PNA AKI Polysubstance Abuse Bipolar Disorder  Disposition: home  Discharge Condition: medically stable  Discharge Exam:   Gen: Alert and Oriented x 3, NAD HEENT: Normocephalic, atraumatic, PERRLA, EOMI CV: RRR, no murmurs, normal S1, S2 split, +2 pulses dorsalis pedis bilaterally Resp: CTAB, no wheezing, rales, or rhonchi, comfortable work of breathing Abd: non-distended, non-tender, soft, +bs in all four quadrants MSK: Moves in all four extremities Ext: no clubbing, cyanosis, or edema Skin: warm, dry, intact, no rashes  Brief Hospital Course:  [name] is a 32y/o male with PMH of Bipolar Depression and Polysubstance Abuse who was brought to the ED on 6/1 after ingestion of cocaine. UDS was positive for cocaine and marijuana. After ingestion patient became unresponsive and EMS was called. Upon arrival EMS administered Narcan and patient awakened and then quickly vomited with concern for aspiration as he had poor awareness. CXR showed right lower lobe consolidation consistent with aspiration PNA. He was started on IV Clindamycin in the ED and after remaining afebrile and with his WBC down trending he was converted to oral antibiotic Augmentin. He tolerated the oral antibiotic well, his cough improved, WBC returned to normal he had no more nausea or vomiting.  Issues for Follow Up:  1. Please ensure patient take full course of antibiotic 2. Please follow up that patient blood cultures remained negative. At time of discharge  they had been negative for 2 days. 3. [name] expressed a desire to get resources to help him deal with his polysubstance use, specifically cocaine. Social work provided resources in the hospital but close follow up to help him in his desire to no longer use these substances.  Significant Procedures:   None  Significant Labs and Imaging:  Recent Labs  Lab 09/02/17 0213 09/03/17 0340 09/04/17 0307  WBC 24.2* 12.8* 10.2  HGB 16.2 13.7 13.4  HCT 50.3 42.8 41.3  PLT 221 175 174   Recent Labs  Lab 09/01/17 2230 09/02/17 0213 09/03/17 0340 09/04/17 0307  NA 143 137 140 140  K 4.5 4.3 3.7 3.8  CL 99* 100* 106 107  CO2 22 22 29 28   GLUCOSE 146* 93 103* 94  BUN 11 14 8 8   CREATININE 1.91* 1.42* 1.30* 1.36*  CALCIUM 9.5 9.2 8.8* 8.8*   6/2 - CK: 1,272 6/2 - Troponin: 0.06 6/2 - Lactic Acid 0.9 6/3 - BCx: NG x 1 day  Results/Tests Pending at Time of Discharge:   None  Discharge Medications:  Allergies as of 09/04/2017   No Known Allergies     Medication List    TAKE these medications   amoxicillin-clavulanate 875-125 MG tablet Commonly known as:  AUGMENTIN Take 1 tablet by mouth every 12 (twelve) hours for 3 days.   folic acid 1 MG tablet Commonly known as:  FOLVITE Take 1 tablet (1 mg total) by mouth daily. Start taking on:  09/05/2017   PARoxetine 20 MG tablet Commonly known as:  PAXIL Take 20 mg by mouth daily.   QUEtiapine 50 MG tablet Commonly known as:  SEROQUEL Take 50 mg by mouth every morning.   thiamine  100 MG tablet Take 1 tablet (100 mg total) by mouth daily. Start taking on:  09/05/2017   valACYclovir 500 MG tablet Commonly known as:  VALTREX Take 500 mg by mouth daily.       Discharge Instructions: Please refer to Patient Instructions section of EMR for full details.  Patient was counseled important signs and symptoms that should prompt return to medical care, changes in medications, dietary instructions, activity restrictions, and follow  up appointments.   Follow-Up Appointments: Follow-up Information    [facility] Follow up.   Specialty:  Internal Medicine Contact information: [address] [place] [address] [phone]           Arlyce Harman, DO 09/04/2017, 3:22 PM PGY-1, [facility]

## 2017-09-04 NOTE — Progress Notes (Signed)
Discharge instructions read and given to patient. Pt voice understanding. Telemetry and IV removed. Belongings at bedside and ready to go. Pt fully dressed. Pt ride outside. NT transporting pt out via wheelchair.

## 2017-09-04 NOTE — Progress Notes (Signed)
CSW met with pt to discuss current drug use and desire for rehab resources.  Pt reports using cocaine 1-2x per month recreationally- did not think this was a problem till he snorted something that he thinks was not entirely cocaine and resulted in this hospital stay.  Now pt interested in outpatient resources to assist in quitting  CSW provided list of outpatient resources- no questions at this time- CSW signing off   H. , LCSW Clinical Social Worker [phone]  

## 2017-09-07 LAB — CULTURE, BLOOD (ROUTINE X 2)
CULTURE: NO GROWTH
Culture: NO GROWTH
SPECIAL REQUESTS: ADEQUATE
SPECIAL REQUESTS: ADEQUATE

## 2017-09-27 ENCOUNTER — Other Ambulatory Visit: Payer: Self-pay | Admitting: Family Medicine

## 2017-10-02 ENCOUNTER — Inpatient Hospital Stay (INDEPENDENT_AMBULATORY_CARE_PROVIDER_SITE_OTHER): Payer: Self-pay | Admitting: Physician Assistant

## 2019-01-31 ENCOUNTER — Other Ambulatory Visit: Payer: Self-pay

## 2019-01-31 DIAGNOSIS — Z20822 Contact with and (suspected) exposure to covid-19: Secondary | ICD-10-CM

## 2019-02-02 LAB — NOVEL CORONAVIRUS, NAA: SARS-CoV-2, NAA: NOT DETECTED

## 2019-04-11 ENCOUNTER — Ambulatory Visit: Payer: No Typology Code available for payment source | Attending: Internal Medicine

## 2019-04-11 DIAGNOSIS — Z20822 Contact with and (suspected) exposure to covid-19: Secondary | ICD-10-CM

## 2019-04-13 LAB — NOVEL CORONAVIRUS, NAA: SARS-CoV-2, NAA: NOT DETECTED

## 2019-04-13 LAB — SPECIMEN STATUS REPORT

## 2019-05-13 ENCOUNTER — Other Ambulatory Visit: Payer: No Typology Code available for payment source

## 2019-05-13 ENCOUNTER — Ambulatory Visit: Payer: No Typology Code available for payment source | Attending: Internal Medicine

## 2019-05-13 DIAGNOSIS — Z20822 Contact with and (suspected) exposure to covid-19: Secondary | ICD-10-CM

## 2019-05-14 LAB — NOVEL CORONAVIRUS, NAA: SARS-CoV-2, NAA: NOT DETECTED

## 2019-07-08 ENCOUNTER — Emergency Department (HOSPITAL_COMMUNITY)
Admission: EM | Admit: 2019-07-08 | Discharge: 2019-07-08 | Disposition: A | Discharge status: Home / Self Care | Payer: No Typology Code available for payment source | Attending: Emergency Medicine | Admitting: Emergency Medicine

## 2019-07-08 ENCOUNTER — Encounter (HOSPITAL_COMMUNITY): Payer: Self-pay | Admitting: Behavioral Health

## 2019-07-08 ENCOUNTER — Other Ambulatory Visit: Payer: Self-pay

## 2019-07-08 DIAGNOSIS — F1420 Cocaine dependence, uncomplicated: Secondary | ICD-10-CM | POA: Insufficient documentation

## 2019-07-08 DIAGNOSIS — Z20822 Contact with and (suspected) exposure to covid-19: Secondary | ICD-10-CM | POA: Insufficient documentation

## 2019-07-08 DIAGNOSIS — R45851 Suicidal ideations: Secondary | ICD-10-CM | POA: Insufficient documentation

## 2019-07-08 DIAGNOSIS — F1410 Cocaine abuse, uncomplicated: Secondary | ICD-10-CM

## 2019-07-08 DIAGNOSIS — F319 Bipolar disorder, unspecified: Secondary | ICD-10-CM | POA: Insufficient documentation

## 2019-07-08 DIAGNOSIS — F1910 Other psychoactive substance abuse, uncomplicated: Secondary | ICD-10-CM

## 2019-07-08 LAB — COMPREHENSIVE METABOLIC PANEL
ALT: 66 U/L — ABNORMAL HIGH (ref 0–44)
AST: 42 U/L — ABNORMAL HIGH (ref 15–41)
Albumin: 4.2 g/dL (ref 3.5–5.0)
Alkaline Phosphatase: 43 U/L (ref 38–126)
Anion gap: 14 (ref 5–15)
BUN: 10 mg/dL (ref 6–20)
CO2: 21 mmol/L — ABNORMAL LOW (ref 22–32)
Calcium: 9.8 mg/dL (ref 8.9–10.3)
Chloride: 102 mmol/L (ref 98–111)
Creatinine, Ser: 1.13 mg/dL (ref 0.61–1.24)
GFR calc Af Amer: >60 mL/min (ref >60)
GFR calc non Af Amer: >60 mL/min (ref >60)
Glucose, Bld: 90 mg/dL (ref 70–99)
Potassium: 3.6 mmol/L (ref 3.5–5.1)
Sodium: 137 mmol/L (ref 135–145)
Total Bilirubin: 0.6 mg/dL (ref 0.3–1.2)
Total Protein: 7.2 g/dL (ref 6.5–8.1)

## 2019-07-08 LAB — CBC
HCT: 42.9 % (ref 39.0–52.0)
Hemoglobin: 13.7 g/dL (ref 13.0–17.0)
MCH: 29 pg (ref 26.0–34.0)
MCHC: 31.9 g/dL (ref 30.0–36.0)
MCV: 90.9 fL (ref 80.0–100.0)
Platelets: 243 10*3/uL (ref 150–400)
RBC: 4.72 MIL/uL (ref 4.22–5.81)
RDW: 12.5 % (ref 11.5–15.5)
WBC: 13 10*3/uL — ABNORMAL HIGH (ref 4.0–10.5)
nRBC: 0 % (ref 0.0–0.2)

## 2019-07-08 LAB — RESPIRATORY PANEL BY RT PCR (FLU A&B, COVID)
Influenza A by PCR: NEGATIVE
Influenza B by PCR: NEGATIVE
SARS Coronavirus 2 by RT PCR: NEGATIVE

## 2019-07-08 LAB — RAPID URINE DRUG SCREEN, HOSP PERFORMED
Amphetamines: NOT DETECTED
Barbiturates: NOT DETECTED
Benzodiazepines: NOT DETECTED
Cocaine: POSITIVE — AB
Opiates: NOT DETECTED
Tetrahydrocannabinol: POSITIVE — AB

## 2019-07-08 LAB — ETHANOL: Alcohol, Ethyl (B): <10 mg/dL (ref <10)

## 2019-07-08 LAB — ACETAMINOPHEN LEVEL: Acetaminophen (Tylenol), Serum: <10 ug/mL — ABNORMAL LOW (ref 10–30)

## 2019-07-08 LAB — SALICYLATE LEVEL: Salicylate Lvl: <7 mg/dL — ABNORMAL LOW (ref 7.0–30.0)

## 2019-07-08 NOTE — ED Notes (Signed)
Patient verbalizes understanding of discharge instructions. Opportunity for questioning and answers were provided. Armband removed by staff, pt discharged from ED to home with family 

## 2019-07-08 NOTE — ED Triage Notes (Signed)
Pt here for psychiatric eval. Dx with bipolar but stopped taking meds eight months ago d/t how they made him feel. Wants to detox from alcohol and cocaine. Last drink and last use one hour pta. Denies SI/HI.

## 2019-07-08 NOTE — ED Provider Notes (Signed)
[facility] EMERGENCY DEPARTMENT Provider Note   CSN: [id number] Arrival date & time: 07/08/19  9147     History Chief Complaint  Patient presents with  . Psychiatric Evaluation  . Drug Problem    Kyle Sweeney is a 34 y.o. male.  HPI Patient is a 34 year old male with a past medical history of polysubstance abuse with cocaine and marijuana, has been diagnosed with bipolar and depression in the past and has been on quetiapine and Paxil.  He states that he has used this for years however he stopped taking in 8 months ago.  Patient states that he has also seen a psychiatrist and therapist in the past but stopped doing this as well.  He states that he was diagnosed with bipolar 3 years ago.  He states that he began using cocaine 2 weeks ago very consistently although he is used intermittently in the past.  He states that he last used this morning at 6 AM and states that he has not been drinking alcohol.  He states that he only drinks 3-4 beers per month.  He states that he only drinks 1-2 times per month.  He denies any other drug use.  Patient states that he has no HI, AVH but does endorse SI.  He states he has no plan however states that he would like to talk to psychiatric team in ED.     Past Medical History:  Diagnosis Date  . Herpes     Patient Active Problem List   Diagnosis Date Noted  . Aspiration pneumonitis (HCC) 09/02/2017  . Aspiration pneumonia (HCC) 09/02/2017  . Aspiration pneumonia of right middle lobe due to vomit (HCC)   . Hypoxia   . Polysubstance abuse (HCC)     No past surgical history on file.     No family history on file.  Social History   Tobacco Use  . Smoking status: Never Smoker  . Smokeless tobacco: Never Used  Substance Use Topics  . Alcohol use: Yes    Comment: occasional use  . Drug use: Yes    Types: Cocaine, Marijuana    Comment: 1 gram 2-3 times weekly    Home Medications Prior to Admission medications     Medication Sig Start Date End Date Taking? Authorizing Provider  folic acid (FOLVITE) 1 MG tablet Take 1 tablet (1 mg total) by mouth daily. 09/05/17   Arlyce Harman, DO  PARoxetine (PAXIL) 20 MG tablet Take 20 mg by mouth daily. 08/01/17   [provider]  QUEtiapine (SEROQUEL) 50 MG tablet Take 50 mg by mouth every morning.  08/01/17   [provider]  thiamine 100 MG tablet Take 1 tablet (100 mg total) by mouth daily. 09/05/17   Arlyce Harman, DO  valACYclovir (VALTREX) 500 MG tablet Take 500 mg by mouth daily.    [provider]    Allergies    Patient has no known allergies.  Review of Systems   Review of Systems  Constitutional: Negative for chills and fever.  HENT: Negative for congestion.   Respiratory: Negative for shortness of breath.   Cardiovascular: Negative for chest pain.  Gastrointestinal: Negative for abdominal pain.  Musculoskeletal: Negative for neck pain.  Psychiatric/Behavioral: Positive for agitation and dysphoric mood.    Physical Exam Updated Vital Signs BP 108/66 (BP Location: Right Arm)   Pulse 86   Temp 98.2 F (36.8 C) (Oral)   Resp 16   SpO2 98%   Physical Exam Vitals and  nursing note reviewed.  Constitutional:      General: He is not in acute distress.    Appearance: Normal appearance. He is not ill-appearing or toxic-appearing.  HENT:     Head: Normocephalic and atraumatic.     Mouth/Throat:     Mouth: Mucous membranes are moist.  Eyes:     General: No scleral icterus.       Right eye: No discharge.        Left eye: No discharge.     Conjunctiva/sclera: Conjunctivae normal.  Cardiovascular:     Rate and Rhythm: Normal rate.  Pulmonary:     Effort: Pulmonary effort is normal.     Breath sounds: No stridor.  Musculoskeletal:        General: Normal range of motion.     Comments: Ambulatory, moves all 4 extremities without difficulty.  Skin:    General: Skin is warm and dry.     Comments: No rashes, IV track  marks, lesions or wounds to the arms, trunk, back, lower extremities.  Neurological:     Mental Status: He is alert and oriented to person, place, and time. Mental status is at baseline.     Comments: Sensation intact all 4 extremities.  No cranial nerve deficits.  Strength discussed in MSK exam  Psychiatric:     Comments: Patient shows good insight, judgment, able answer questions appropriately follow commands. Alert and oriented x3.     ED Results / Procedures / Treatments   Labs (all labs ordered are listed, but only abnormal results are displayed) Labs Reviewed  COMPREHENSIVE METABOLIC PANEL - Abnormal; Notable for the following components:      Result Value   CO2 21 (*)    AST 42 (*)    ALT 66 (*)    All other components within normal limits  CBC - Abnormal; Notable for the following components:   WBC 13.0 (*)    All other components within normal limits  RAPID URINE DRUG SCREEN, HOSP PERFORMED - Abnormal; Notable for the following components:   Cocaine POSITIVE (*)    Tetrahydrocannabinol POSITIVE (*)    All other components within normal limits  RESPIRATORY PANEL BY RT PCR (FLU A&B, COVID)  ETHANOL  SALICYLATE LEVEL  ACETAMINOPHEN LEVEL    EKG None  Radiology No results found.  Procedures Procedures (including critical care time)  Medications Ordered in ED Medications - No data to display  ED Course  I have reviewed the triage vital signs and the nursing notes.  Pertinent labs & imaging results that were available during my care of the patient were reviewed by me and considered in my medical decision making (see chart for details).    MDM Rules/Calculators/A&P                      Patient 34 year old male with a history of bipolar and depression.  Please presented today with concerns for suicidal ideation as well as drug use.  I discussed with him the limitations of what we can do in the ED today.  He is requesting to talk to psychiatry.  I believe this  is reasonable considering he has some SI with no plan.  Consultation to TTS placed.  Labs ordered in order to medically clear patient.  UDS positive for cocaine and THC consistent with patient's history. CMP with no acute abnormalities.  AST and ALT very mildly elevated which is consistent with his historic abnormalities.  He has no abdominal  pain right upper quadrant pain or diarrhea to indicate liver disease or cholecystitis.  This is very mild white count 13.0 however denies any fevers, chills, infectious symptoms, cough cold congestion abdominal pain chest pain shortness of breath headache or dizziness.  Low suspicion for infection versus cancer.  Suspect that leukocytosis is secondary to cocaine use.  Physical exam unremarkable.  Vital signs within normal limits was initially somewhat tachycardic at 107 in triage however this resolved with intervention.  DDx recommended discharge and will provide patient with some further resources.  APAP, ASA and Covid test pending at this time.  Patient care pending result of this will be transferred to [place] PA-C.  Anticipate discharge after APAP/EtOH, ASA result.     Final Clinical Impression(s) / ED Diagnoses Final diagnoses:  Polysubstance abuse ([place])  Cocaine abuse ([place])  Suicidal ideation    Rx / DC Orders ED Discharge Orders    None       Tedd Sias, [place] 07/08/19 1912    Wyvonnia Dusky, MD 07/09/19 1217

## 2019-07-08 NOTE — Discharge Instructions (Addendum)
Please cease all drug use.  Please not use any alcohol.  Please follow-up with the psychiatric team that you have seen in the past or use resource guide for outpatient counseling and substance abuse.   Please schedule appointment with psychiatry in order to be evaluated and to have your quetiapine/Seroquel and Paxil restarted.

## 2019-07-08 NOTE — ED Provider Notes (Signed)
Transferred from [place], PA-C at shift change.  See note for full HPI.  Patient presented with suicidal thoughts as well as drug use.  Denies any plan.  Drug use would like resources outpatient.   At my assumption of care patient is ready been cleared from psychiatry.  He is waiting on completion of labs.  Likely discharge home.  On my assessment patient denies any SI, HI, AVH.  He has been given resources for outpatient substance use treatment.  Labs without any significant findings.  Will discharge home.  The patient has been appropriately medically screened and/or stabilized in the ED. I have low suspicion for any other emergent medical condition which would require further screening, evaluation or treatment in the ED or require inpatient management.  Patient is hemodynamically stable and in no acute distress.  Patient able to ambulate in department prior to ED.  Evaluation does not show acute pathology that would require ongoing or additional emergent interventions while in the emergency department or further inpatient treatment.  I have discussed the diagnosis with the patient and answered all questions.  Pain is been managed while in the emergency department and patient has no further complaints prior to discharge.  Patient is comfortable with plan discussed in room and is stable for discharge at this time.  I have discussed strict return precautions for returning to the emergency department.  Patient was encouraged to follow-up with PCP/specialist refer to at discharge.   Linwood Dibbles, PA-C 07/08/19 1947    Terald Sleeper, MD 07/09/19 1217

## 2019-07-08 NOTE — BH Assessment (Signed)
Tele Assessment Note   Patient Name: Kyle Sweeney MRN: [id number] Referring Physician: Renaye Rakers Location of Patient: [facility] Location of Provider: Behavioral Health TTS Department  Kyle Sweeney is an 34 y.o. male who presented to [facility] seeking help for his bipolar disorder and his cocaine addiction.  Patient states that he has been off his bipolar medication for the past eight months.  He states that he had a psychiatrist in the past, but stopped going to see him and stopped his medications because he did not feel like he needed them anymore.  He states that he was prescribed Latuda and Paxil. Patient states that he has been self-medicating his bipolar disorder with cocaine, 1 gram, snorting, 2-3 times weekly.  He states that his last use was last night.  Patient states that he is experiencing increased paranoia and visual hallucinations.  Patient states that he is depressed, but denies having any suicidal thoughts and denies any previous attempts.  Patient denies HI.  Patient states that he knows that he can do better than he is doing.  He states that he just needs to get back on his medication and stop using cocaine.  Patient states that he is only sleeping four hours per night, but states that his appetite is good.  Patient states that he has a history of abuse and incarceration and states that he has seen things and been through things that were difficult for him and changed the way he manages his life at times.  He states that he is also having relationship issues.  Patient states that he works six days a week at [employer] and states that he is just not taking the care of himself that he needs to.   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 Herpes     No past surgical history on file.  Family History: No family history on file.  Social History:  reports that he has never smoked. He has never used  smokeless tobacco. He reports current alcohol use. He reports current drug use. Drugs: Cocaine and Marijuana.  Additional Social History:  Alcohol / Drug Use Pain Medications: see MAR Prescriptions: see MAR Over the Counter: see MAR History of alcohol / drug use?: Yes Longest period of sobriety (when/how long): several years while incarcerated Substance #1 Name of Substance 1: cocaine 1 - Age of First Use: 19 1 - Amount (size/oz): 1 gram 1 - Frequency: 2-3 weekly 1 - Duration: 1 month 1 - Last Use / Amount: last pm Substance #2 Name of Substance 2: Marijuana 2 - Age of First Use: 19 2 - Amount (size/oz): 1/8 ounce 2 - Frequency: 1 x week 2 - Duration: since onset 2 - Last Use / Amount: UTA  CIWA: CIWA-Ar BP: 108/66 Pulse Rate: 86 Nausea and Vomiting: no nausea and no vomiting Tactile Disturbances: none Tremor: no tremor Auditory Disturbances: not present Paroxysmal Sweats: no sweat visible Visual Disturbances: not present Anxiety: no anxiety, at ease Headache, Fullness in Head: none present Agitation: normal activity Orientation and Clouding of Sensorium: oriented and can do serial additions CIWA-Ar Total: 0 COWS:    Allergies: No Known Allergies  Home Medications: (Not in a hospital admission)   OB/GYN Status:  No LMP for male patient.  General Assessment Data Location of Assessment: [facility] ED TTS Assessment: In system Is this a Tele or Face-to-Face Assessment?: Tele Assessment Is this an Initial Assessment or a  Re-assessment for this encounter?: Initial Assessment Patient Accompanied by:: N/A Language Other than English: No Living Arrangements: Other (Comment)(lives on his own) What gender do you identify as?: Male Marital status: Single Living Arrangements: Alone Can pt return to current living arrangement?: Yes Admission Status: Voluntary Is patient capable of signing voluntary admission?: No Referral Source: Self/Family/Friend Insurance type: Medicaid      Crisis Care Plan Living Arrangements: Alone Legal Guardian: Other:(self) Name of Psychiatrist: none Name of Therapist: none  Education Status Is patient currently in school?: No Is the patient employed, unemployed or receiving disability?: Employed  Risk to self with the past 6 months Suicidal Ideation: No Has patient been a risk to self within the past 6 months prior to admission? : No Suicidal Intent: No Has patient had any suicidal intent within the past 6 months prior to admission? : No Is patient at risk for suicide?: No Suicidal Plan?: No Has patient had any suicidal plan within the past 6 months prior to admission? : No Access to Means: No What has been your use of drugs/alcohol within the last 12 months?: none Previous Attempts/Gestures: No How many times?: 0 Other Self Harm Risks: none Triggers for Past Attempts: None known Intentional Self Injurious Behavior: None Family Suicide History: No Recent stressful life event(s): Conflict (Comment)(with girlfriend) Persecutory voices/beliefs?: No Depression: Yes Depression Symptoms: Despondent, Isolating, Loss of interest in usual pleasures Substance abuse history and/or treatment for substance abuse?: Yes Suicide prevention information given to non-admitted patients: Not applicable  Risk to Others within the past 6 months Homicidal Ideation: No Does patient have any lifetime risk of violence toward others beyond the six months prior to admission? : No Thoughts of Harm to Others: No Current Homicidal Intent: No Current Homicidal Plan: No Access to Homicidal Means: No Identified Victim: none History of harm to others?: No Assessment of Violence: None Noted Violent Behavior Description: none Does patient have access to weapons?: No Criminal Charges Pending?: No Does patient have a court date: No Is patient on probation?: No  Psychosis Hallucinations: Visual Delusions: (paranoid delusions)  Mental Status  Report Appearance/Hygiene: Unremarkable Eye Contact: Good Motor Activity: Freedom of movement Speech: Unremarkable Level of Consciousness: Alert Mood: Depressed Affect: Appropriate to circumstance Anxiety Level: Minimal Thought Processes: Coherent, Relevant Judgement: Partial Orientation: Person, Place, Time, Situation Obsessive Compulsive Thoughts/Behaviors: None  Cognitive Functioning Concentration: Decreased Memory: Recent Intact, Remote Intact Is patient IDD: No Insight: Good Impulse Control: Fair Appetite: Good Have you had any weight changes? : No Change Sleep: Decreased Total Hours of Sleep: 4 Vegetative Symptoms: None  ADLScreening [facility] Assessment Services) Patient's cognitive ability adequate to safely complete daily activities?: Yes Patient able to express need for assistance with ADLs?: Yes Independently performs ADLs?: Yes (appropriate for developmental age)  Prior Inpatient Therapy Prior Inpatient Therapy: No  Prior Outpatient Therapy Prior Outpatient Therapy: No Does patient have an ACCT team?: No Does patient have Intensive In-House Services?  : No Does patient have [facility] services? : No Does patient have P4CC services?: No  ADL Screening (condition at time of admission) Patient's cognitive ability adequate to safely complete daily activities?: Yes Is the patient deaf or have difficulty hearing?: No Does the patient have difficulty seeing, even when wearing glasses/contacts?: No Does the patient have difficulty concentrating, remembering, or making decisions?: No Patient able to express need for assistance with ADLs?: Yes Does the patient have difficulty dressing or bathing?: No Independently performs ADLs?: Yes (appropriate for developmental age) Does the patient have difficulty  walking or climbing stairs?: No Weakness of Legs: None Weakness of Arms/Hands: None  Home Assistive Devices/Equipment Home Assistive Devices/Equipment: None  Therapy  Consults (therapy consults require a physician order) PT Evaluation Needed: No OT Evalulation Needed: No SLP Evaluation Needed: No Abuse/Neglect Assessment (Assessment to be complete while patient is alone) Abuse/Neglect Assessment Can Be Completed: Yes Physical Abuse: Denies Verbal Abuse: Denies Sexual Abuse: Denies Exploitation of patient/patient's resources: Denies Self-Neglect: Denies Values / Beliefs Cultural Requests During Hospitalization: None Spiritual Requests During Hospitalization: None Consults Spiritual Care Consult Needed: No Transition of Care Team Consult Needed: No Advance Directives (For Healthcare) Does Patient Have a Medical Advance Directive?: No Would patient like information on creating a medical advance directive?: No - Patient declined Nutrition Screen- [facility] Adult/[facility]/AP Has the patient recently lost weight without trying?: No Has the patient been eating poorly because of a decreased appetite?: No Malnutrition Screening Tool Score: 0        Disposition: Per Shuvon Rankin, NP, patient can be discharged from ED to follow-[facility] with [facility] OP Behavioral Health Disposition Initial Assessment Completed for this Encounter: Yes Patient referred to: Outpatient clinic referral  This service was provided via telemedicine using a 2-way, interactive audio and video technology.  Names of all persons participating in this telemedicine service and their role in this encounter. Name: Kyle Sweeney Role: patient  Name: Zoey Bidwell Role: TTS  Name:  Role:   Name:  Role:     Daphene Calamity 07/08/2019 7:16 PM

## 2019-07-09 ENCOUNTER — Telehealth (HOSPITAL_COMMUNITY): Payer: Self-pay | Admitting: Psychiatry

## 2019-07-09 NOTE — Telephone Encounter (Signed)
D:  Kyle Sweeney (TTS) referred pt to MH-IOP/PHP.  A:  Placed call to orient pt.  After reading pt'Sweeney chart, he has Medicaid and neither group takes MCD.  Pt is also in need of another level of care.  Pt states he doesn't have any insurance at this time.  Provided pt with other referral sources phone numbers (ie. [facility], [facility] and [facility]).  R:  Pt receptive.

## 2020-04-25 ENCOUNTER — Ambulatory Visit (HOSPITAL_COMMUNITY)
Admission: EM | Admit: 2020-04-25 | Discharge: 2020-04-25 | Disposition: A | Discharge status: Home / Self Care | Payer: 59

## 2020-04-25 ENCOUNTER — Other Ambulatory Visit: Payer: Self-pay

## 2020-04-25 ENCOUNTER — Encounter (HOSPITAL_COMMUNITY): Payer: Self-pay | Admitting: Emergency Medicine

## 2020-04-25 DIAGNOSIS — F3130 Bipolar disorder, current episode depressed, mild or moderate severity, unspecified: Secondary | ICD-10-CM | POA: Diagnosis not present

## 2020-04-25 DIAGNOSIS — F319 Bipolar disorder, unspecified: Secondary | ICD-10-CM

## 2020-04-25 NOTE — ED Triage Notes (Signed)
Pt presents to [facility] c/o dealing with mental health issues. Pt states he has not been on medicine for about a year and has been having anxiety and stress when he drives and at work. Pt denies SI/HI, AVH.

## 2020-04-25 NOTE — BH Assessment (Signed)
Comprehensive Clinical Assessment (CCA) Note  04/25/2020 Kyle Sweeney [id number]    Patient presents to the [facility] as a walk-in seeking help for his anxiety and depression.  Patient states that he has been diagnosed with Bipolar Disorder and he has recently been having thoughts about not wanting to be here.  He states that he has suicidal thoughts, but no plan or intent and states that he has never attempted to harm himself in the past.  Patient states that he has never been in a psychiatric hospital in the past, but states about a year and a half ago that he was seeing a psychiatrist and states that he was on medications, but discontinued taking them.  Patient states that his anxiety is so high that he sometimes is unable to drive, he has problems keeping a job because it is difficult for him to be around other people and he states that he was in a relationship for the past six years that just ended because his girlfriend could no longer deal with his unstable mental health issues.  Patient denies HI/Psychosis.  Ptient states that he experiences sleep disturbance and states that he wakes up throughout the night.  He sleeps 4-5 hours per night.  Patient states that he has also experienced a decreased appetite, but he is not sure if he has lost any weight or not.  Patient states that he has a history of cocaine addiction, but states that he has not used in 3 years.  He admits to smoking 1 gram of marijuana weekly and drinking 1-2 beers daily.  Patient presents as alert and oriented.  He has a depressed mood, but his affect is appropriate.  His judgment, insight and impulse control are mostly intact.  His thoughts are organized and his memory is intact.  He does not appear to be responding to any internal stimuli.  Chief Complaint:  Chief Complaint  Patient presents with  . Anxiety  . Stress   Visit Diagnosis: F31.30 Bipolar Disorder Depressed    CCA Screening, Triage and Referral (STR)  Patient  Reported Information How did you hear about [place]? Family/Friend  Referral name: Agnes Probert [facility] 04/25/2020)  Referral phone number: No data recorded  Whom do you see for routine medical problems? I don't have a doctor (Phreesia 04/25/2020)  Practice/Facility Name: No data recorded Practice/Facility Phone Number: No data recorded Name of Contact: No data recorded Contact Number: No data recorded Contact Fax Number: No data recorded Prescriber Name: No data recorded Prescriber Address (if known): No data recorded  What Is the Reason for Your Visit/Call Today? Anxiety And Bipolar Not Able To Sleep Have Not Had Meds Since 05-18-2018  (Phreesia 04/25/2020)  How Long Has This Been Causing You Problems? 1-6 months (Phreesia 04/25/2020)  What Do You Feel Would Help You the Most Today? Medication (Phreesia 04/25/2020)   Have You Recently Been in Any Inpatient Treatment (Hospital/Detox/Crisis Center/28-Day Program)? No (Phreesia 04/25/2020)  Name/Location of Program/Hospital:No data recorded How Long Were You There? No data recorded When Were You Discharged? No data recorded  Have You Ever Received Services From [facility] Before? No (Phreesia 04/25/2020)  Who Do You See at [facility]? No data recorded  Have You Recently Had Any Thoughts About Hurting Yourself? Yes (Phreesia 04/25/2020)  Are You Planning to Commit Suicide/Harm Yourself At This time? No (Phreesia 04/25/2020)   Have you Recently Had Thoughts About Hurting Someone [name]? No (Phreesia 04/25/2020)  Explanation: No data recorded  Have You Used Any  Alcohol or Drugs in the Past 24 Hours? Yes (Phreesia 04/25/2020)  How Long Ago Did You Use Drugs or Alcohol? No data recorded What Did You Use and How Much? Weed One Gram (Phreesia 04/25/2020)   Do You Currently Have a Therapist/Psychiatrist? No  Name of Therapist/Psychiatrist: No data recorded  Have You Been Recently Discharged From Any Office Practice or  Programs? No  Explanation of Discharge From Practice/Program: No data recorded    CCA Screening Triage Referral Assessment Type of Contact: Face-to-Face  Is this Initial or Reassessment? No data recorded Date Telepsych consult ordered in [facility]:  No data recorded Time Telepsych consult ordered in [facility]:  No data recorded  Patient Reported Information Reviewed? Yes  Patient Left Without Being Seen? No data recorded Reason for Not Completing Assessment: No data recorded  Collateral Involvement: not available   Does Patient Have a Court Appointed Legal Guardian? No data recorded Name and Contact of Legal Guardian: No data recorded If Minor and Not Living with Parent(s), Who has Custody? No data recorded Is CPS involved or ever been involved? Never  Is APS involved or ever been involved? Never   Patient Determined To Be At Risk for Harm To Self or Others Based on Review of Patient Reported Information or Presenting Complaint? No  Method: No data recorded Availability of Means: No data recorded Intent: No data recorded Notification Required: No data recorded Additional Information for Danger to Others Potential: No data recorded Additional Comments for Danger to Others Potential: No data recorded Are There Guns or Other Weapons in Your Home? No data recorded Types of Guns/Weapons: No data recorded Are These Weapons Safely Secured?                            No data recorded Who Could Verify You Are Able To Have These Secured: No data recorded Do You Have any Outstanding Charges, Pending Court Dates, Parole/Probation? No data recorded Contacted To Inform of Risk of Harm To Self or Others: No data recorded  Location of Assessment: [facility]   Does Patient Present under Involuntary Commitment? No  IVC Papers Initial File Date: No data recorded  [place] of Residence: [place]   Patient Currently Receiving the Following Services: Not Receiving  Services   Determination of Need: Routine (7 days)   Options For Referral: Medication Management; Outpatient Therapy     CCA Biopsychosocial Intake/Chief Complaint:  Patient presents to the [facility] as a walk-in seeking help for his anxiety and depression.  Patient states that he has been diagnosed with Bipolar Disorder and he has recently been having thoughts about not wanting to be here.  He states that he has suicidal thoughts, but no plan or intent and states that he has never attempted to harm himself in the past.  Patient states that he has never been in a psychiatric hospital in the past, but states about a year and a half ago that he was seeing a psychiatrist and states that he was on medications, but discontinued taking them.  Patient states that his anxiety is so high that he sometimes is unable to drive, he has problems keeping a job because it is difficult for him to be around other people and he states that he was in a relationship for the past six years that just ended because his girlfriend could no longer deal with his unstable mental health issues.  Patient denies HI/Psychosis.  Ptient states that  he experiences sleep disturbance and states that he wakes up throughout the night.  He sleeps 4-5 hours per night.  Patient states that he has also experienced a decreased appetite, but he is not sure if he has lost any weight or not.  Patient states that he has a history of cocaine addiction, but states that he has not used in 3 years.  He admits to smoking 1 gram of marijuana weekly and drinking 1-2 beers daily.  Current Symptoms/Problems: Increased depression and anxiety   Patient Reported Schizophrenia/Schizoaffective Diagnosis in Past: No   Strengths: Patient states that he is ambitious, positive and hard working  Preferences: Patient has no preferences that require accommodation  Abilities: Patient states that he is good with his hands   Type of Services Patient Feels are  Needed: Patient wants OP therapy and medication management   Initial Clinical Notes/Concerns: No data recorded  Mental Health Symptoms Depression:  Change in energy/activity; Sleep (too much or little); Irritability; Increase/decrease in appetite   Duration of Depressive symptoms: Greater than two weeks   Mania:  None   Anxiety:   None   Psychosis:  None   Duration of Psychotic symptoms: No data recorded  Trauma:  None   Obsessions:  None   Compulsions:  None   Inattention:  None   Hyperactivity/Impulsivity:  N/A   Oppositional/Defiant Behaviors:  None   Emotional Irregularity:  No data recorded  Other Mood/Personality Symptoms:  No data recorded   Mental Status Exam Appearance and self-care  Stature:  Average   Weight:  Thin   Clothing:  Casual; Meticulous; Neat/clean   Grooming:  Well-groomed   Cosmetic use:  None   Posture/gait:  Normal   Motor activity:  Not Remarkable   Sensorium  Attention:  Normal   Concentration:  Normal   Orientation:  Object; Person; Place; Situation; Time   Recall/memory:  Normal   Affect and Mood  Affect:  Appropriate   Mood:  Depressed; Anxious   Relating  Eye contact:  Normal   Facial expression:  Depressed; Anxious   Attitude toward examiner:  Cooperative   Thought and Language  Speech flow: Clear and Coherent   Thought content:  Appropriate to Mood and Circumstances   Preoccupation:  None   Hallucinations:  None   Organization:  No data recorded  Affiliated Computer ServicesExecutive Functions  Fund of Knowledge:  Good   Intelligence:  Above Average   Abstraction:  Normal   Judgement:  Good   Reality Testing:  Realistic   Insight:  Good   Decision Making:  Normal   Social Functioning  Social Maturity:  Isolates   Social Judgement:  Normal   Stress  Stressors:  Relationship; Work   Coping Ability:  Human resources officerverwhelmed   Skill Deficits:  None   Supports:  Family     Religion: Religion/Spirituality Are You A  Religious Person?: Yes What is Your Religious Affiliation?: ChiropodistChristian  Leisure/Recreation: Leisure / Recreation Do You Have Hobbies?: Yes Leisure and Hobbies: basketball and video games  Exercise/Diet: Exercise/Diet Do You Exercise?: No Have You Gained or Lost A Significant Amount of Weight in the Past Six Months?: Yes-Lost Number of Pounds Lost?:  (amount of weight lost unknown) Do You Follow a Special Diet?: No Do You Have Any Trouble Sleeping?: Yes Explanation of Sleeping Difficulties: broken sleep, sleeps 4-5 hrs   CCA Employment/Education Employment/Work Situation: Employment / Work Situation Employment situation: Employed Where is patient currently employed?: [employer] How long has patient been employed?:  2 months Patient's job has been impacted by current illness: Yes Describe how patient's job has been impacted: patient states that he is unable to maintain employment due to his mental illness What is the longest time patient has a held a job?: Not assessed Where was the patient employed at that time?: not assessed Has patient ever been in the Eli Lilly and Companymilitary?: No  Education: Education Is Patient Currently Attending School?: No Last Grade Completed: 11 Name of High School: [employer] Did Garment/textile technologistYou Graduate From [employer] School?: No Did Theme park managerYou Attend College?: No Did Designer, television/film setYou Attend Graduate School?: No Did You Have An Individualized Education Program (IIEP): No Did You Have Any Difficulty At School?: No Patient's Education Has Been Impacted by Current Illness: No   CCA Family/Childhood History Family and Relationship History: Family history Marital status: Single Are you sexually active?: Yes What is your sexual orientation?: heterosexual Has your sexual activity been affected by drugs, alcohol, medication, or emotional stress?: none reported Does patient have children?: No  Childhood History:  Childhood History By whom was/is the patient raised?: Both parents Description  of patient's relationship with caregiver when they were a child: patient states that he was close to his parents growing up Patient's description of current relationship with people who raised him/her: Patient states that he maintains a close relationship with his father, but his mother is emotionally detached How were you disciplined when you got in trouble as a child/adolescent?: whippings and loss of electronics Does patient have siblings?: Yes Number of Siblings: 6 Description of patient's current relationship with siblings: patient states that he has a close relationship with his siblings Did patient suffer any verbal/emotional/physical/sexual abuse as a child?: No Did patient suffer from severe childhood neglect?: No Has patient ever been sexually abused/assaulted/raped as an adolescent or adult?: No Was the patient ever a victim of a crime or a disaster?: No Witnessed domestic violence?: No  Child/Adolescent Assessment:     CCA Substance Use Alcohol/Drug Use: Alcohol / Drug Use Pain Medications: see MAR Prescriptions: see MAR Over the Counter: see MAR History of alcohol / drug use?: Yes Longest period of sobriety (when/how long): several years while incarcerated Negative Consequences of Use: Legal,Financial,Personal relationships,Work / School Substance #1 Name of Substance 1: alcohol 1 - Age of First Use: 15 1 - Amount (size/oz): 1-2 beers 1 - Frequency: daily 1 - Duration: on-going 1 - Last Use / Amount: last pm Substance #2 Name of Substance 2: marijuana 2 - Age of First Use: not assessed 2 - Amount (size/oz): 1 gram 2 - Frequency: 1 x week 2 - Duration: since onset 2 - Last Use / Amount: 3 days ago                     ASAM's:  Six Dimensions of Multidimensional Assessment  Dimension 1:  Acute Intoxication and/or Withdrawal Potential:   Dimension 1:  Description of individual's past and current experiences of substance use and withdrawal: Patient denies  any current withdrawal symptoms  Dimension 2:  Biomedical Conditions and Complications:   Dimension 2:  Description of patient's biomedical conditions and  complications: Patient has no medical issues complicated  by his use or caused by his use of drugs and alcohol  Dimension 3:  Emotional, Behavioral, or Cognitive Conditions and Complications:  Dimension 3:  Description of emotional, behavioral, or cognitive conditions and complications: Patient states that he uses drugs and alcohol to self-medicate his emotions  Dimension 4:  Readiness to Change:  Dimension 4:  Description of Readiness to Change criteria: Patient states that he is ready to take the steps necessary to change and is in the contemplation stage of change  Dimension 5:  Relapse, Continued use, or Continued Problem Potential:  Dimension 5:  Relapse, continued use, or continued problem potential critiera description: Patient has a history of shronic relapses  Dimension 6:  Recovery/Living Environment:  Dimension 6:  Recovery/Iiving environment criteria description: Patient is currently living in a safe and supportive environment  ASAM Severity Score: ASAM's Severity Rating Score: 6  ASAM Recommended Level of Treatment: ASAM Recommended Level of Treatment: Level II Intensive Outpatient Treatment   Substance use Disorder (SUD) Substance Use Disorder (SUD)  Checklist Symptoms of Substance Use: Continued use despite having a persistent/recurrent physical/psychological problem caused/exacerbated by use,Continued use despite persistent or recurrent social, interpersonal problems, caused or exacerbated by use,Recurrent use that results in a failure to fulfill major role obligations (work, school, home)  Recommendations for Services/Supports/Treatments: Recommendations for Services/Supports/Treatments Recommendations For Services/Supports/Treatments: CD-IOP Intensive Chemical Dependency Program  DSM5 Diagnoses: Patient Active Problem List    Diagnosis Date Noted  . Bipolar I disorder, most recent episode depressed (HCC)   . Aspiration pneumonitis (HCC) 09/02/2017  . Aspiration pneumonia (HCC) 09/02/2017  . Aspiration pneumonia of right middle lobe due to vomit (HCC)   . Hypoxia   . Polysubstance abuse (HCC)     Disposition:  Per Maxie Barb, NP, patient will be discharged home to f/u with OP [facility] for an access appointment on 04/26/20.   Referrals to Alternative Service(s): Referred to Alternative Service(s):   Place:   Date:   Time:    Referred to Alternative Service(s):   Place:   Date:   Time:    Referred to Alternative Service(s):   Place:   Date:   Time:    Referred to Alternative Service(s):   Place:   Date:   Time:     Rimas Gilham J Jolynn Bajorek, LCAS

## 2020-04-25 NOTE — ED Provider Notes (Signed)
Behavioral Health Urgent Care Medical Screening Exam  Patient Name: Kyle Sweeney MRN: [id number] Date of Evaluation: 04/25/20 Chief Complaint:   Diagnosis:  Final diagnoses:  None    History of Present illness: Kyle Sweeney is a 35 y.o. male who presents to [facility] voluntarily seeking outpatient resources to address recent increase in mental health symptoms. Patient reports past psychiatric history of anxiety, depression, and bipolar disorder. Patient reports increased symptoms over the past two weeks including crying spells and suicidal ideations x2-3 nights ago. Denies any active suicidal ideations. Previously seen at [facility]. Patient reports being off his medications 1.5 years with no outpatient supports or treatment. Lives with sister in [place]. Reports some marijuana use; denies any other illicit substances. Endorses feelings of guilt and worthlessness, decreased appetite, energy, and motivation; recent changes in sleep quantity. Patient reports recent increase in symptoms affecting his job and is seeking to get restarted in services.   Patient denies any current suicidal or homicidal ideations, auditory or visual hallucinations, and does not appear to be responding to any external/internal stimuli at this time. Patient states he does feel safe and verbalized a plan to return to outpatient services in the a.m. as walk-in to initiate services for medication management and therapy.   Psychiatric Specialty Exam  Presentation  General Appearance:Appropriate for Environment  Eye Contact:Good  Speech:Clear and Coherent  Speech Volume:Normal  Handedness:No data recorded  Mood and Affect  Mood:Euthymic  Affect:Appropriate   Thought Process  Thought Processes:Coherent; Goal Directed; Linear  Descriptions of Associations:Intact  Orientation:Full (Time, Place and Person)  Thought Content:Logical; WDL  Hallucinations:None  Ideas of  Reference:None  Suicidal Thoughts:No  Homicidal Thoughts:No   Sensorium  Memory:Immediate Good; Recent Good; Remote Good  Judgment:Fair  Insight:Good   Executive Functions  Concentration:Fair  Attention Span:Fair  Recall:Fair  Fund of Knowledge:Fair  Language:Fair   Psychomotor Activity  Psychomotor Activity:Normal   Assets  Assets:Communication Skills; Desire for Improvement; Financial Resources/Insurance; Housing; Leisure Time; Physical Health; Social Support   Sleep  Sleep:Fair  Number of hours: No data recorded  Physical Exam: Physical Exam ROS Blood pressure (!) 142/82, pulse 83, temperature 98.6 F (37 C), temperature source Oral, resp. rate 18, height 5\' 10"  (1.778 m), weight 165 lb (74.8 kg), SpO2 99 %. Body mass index is 23.68 kg/m.  Musculoskeletal: Strength & Muscle Tone: within normal limits Gait & Station: normal Patient leans: N/A   [facility] MSE Discharge Disposition for Follow up and Recommendations: Based on my evaluation the patient does not appear to have an emergency medical condition and can be discharged with resources and follow up care in outpatient services for Medication Management and Individual Therapy   , NP 04/25/2020, 11:00 AM

## 2020-04-25 NOTE — ED Notes (Signed)
[facility] #[phone]  

## 2020-04-26 ENCOUNTER — Ambulatory Visit (INDEPENDENT_AMBULATORY_CARE_PROVIDER_SITE_OTHER): Payer: 59 | Admitting: Psychiatry

## 2020-04-26 ENCOUNTER — Other Ambulatory Visit (HOSPITAL_COMMUNITY): Payer: Self-pay | Admitting: Psychiatry

## 2020-04-26 ENCOUNTER — Encounter (HOSPITAL_COMMUNITY): Payer: Self-pay | Admitting: Psychiatry

## 2020-04-26 DIAGNOSIS — F3130 Bipolar disorder, current episode depressed, mild or moderate severity, unspecified: Secondary | ICD-10-CM

## 2020-04-26 DIAGNOSIS — F419 Anxiety disorder, unspecified: Secondary | ICD-10-CM | POA: Insufficient documentation

## 2020-04-26 MED ORDER — HYDROXYZINE HCL 10 MG PO TABS: 10.0000 mg | ORAL_TABLET | Freq: Two times a day (BID) | ORAL | 1 refills | Status: DC | PRN

## 2020-04-26 MED ORDER — PAROXETINE HCL 10 MG PO TABS: 10.0000 mg | ORAL_TABLET | Freq: Every day | ORAL | 1 refills | Status: DC

## 2020-04-26 MED ORDER — QUETIAPINE FUMARATE 100 MG PO TABS: 100.0000 mg | ORAL_TABLET | Freq: Every day | ORAL | 1 refills | Status: DC

## 2020-04-26 MED FILL — PAROXETINE HCL 10 MG TABS: 10 | 30 days supply | Qty: 30 | Fill #0

## 2020-04-26 MED FILL — QUETIAPINE FUMARATE 100 MG: 100 | 30 days supply | Qty: 30 | Fill #0

## 2020-04-26 NOTE — Progress Notes (Signed)
Psychiatric Initial Adult Assessment   Patient Identification: Kyle Sweeney MRN:  [id number] Date of Evaluation:  04/26/2020   Referral Source: [facility]  Chief Complaint:   " I am having a hard time sleeping and being around people."  Visit Diagnosis:    ICD-10-CM   1. Bipolar I disorder, most recent episode depressed (HCC)  F31.30 QUEtiapine (SEROQUEL) 100 MG tablet    PARoxetine (PAXIL) 10 MG tablet  2. Anxiety  F41.9 PARoxetine (PAXIL) 10 MG tablet    hydrOXYzine (ATARAX/VISTARIL) 10 MG tablet    History of Present Illness: This is a 35 year old male with history of bipolar disorder, anxiety and polysubstance abuse (cocaine and marijuana) now seen for evaluation of presenting as a walk-in. He was evaluated at [facility] behavioral [facility] urgent center yesterday and was cleared without any prescriptions. Patient reported that lately he has been having a hard time with his anxiety and mood.  He stated that he is very anxious all the times and especially when he is driving.  He also feels paranoid about being watched and being followed by some unknown people. He also reported having difficulty with falling asleep.  He stated that he just feels worried and stressed all the time.  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 He stated that it is not difficult for him to find a job but maintaining it is the problem. He stated that he has been having financial issues for over an year and he has been living with his sister for 1-1/2 years now. He wants to get back on medications that helped him in the past.  He informed that he used to take Seroquel and also Paxil in the past.  He asked if these medicines would help him with his anxiety and bipolar disorder symptoms. Writer informed him that Seroquel is commonly used to help with mood stabilization as well as with sleep and anxiety.   He was also informed that Paxil can help with depression anxiety symptoms as well.  Patient stated that he would like to restart this regimen. He informed that he has not used marijuana or cocaine in the last few months. He stated that he is paranoid delusions but denied any auditory or visual hallucinations. He denied any periods of time when he is elated mood with elevated energy levels.  He stated that lately he has been feeling depressed and anxious. He denied any suicidal ideations.  Writer informed him that they can be safely be started on combination of Seroquel and Paxil due to his good response to these in the past.  Writer also suggested adding hydroxyzine for breakthrough anxiety for the first few weeks.  Patient was agreeable to try this. He also requested to be connected to a therapist.    Past Psychiatric History: Bipolar disorder, anxiety, polysubstance abuse, used to be followed up by [facility].  He informed that he has seen more than 1 psychiatrist and therapist there and it seems like every time he would see a new provider.  Previous Psychotropic Medications: Yes Seroquel, Paxil  Substance Abuse History in the last 12 months:  Yes.  Has history of abusing cocaine and marijuana, last time the urine drug screen was positive and April 2021 during a visit to the ED.  Consequences of Substance Abuse: Loss of wages, financial issues, poor work International aid/development worker  Past Medical History:  Past Medical History:  Diagnosis Date  . Herpes    No past surgical history on file.  Family Psychiatric History: denied  Family History: No family history on file.  Social History:   Social History   Socioeconomic History  . Marital status: Single    Spouse name: Not on file  . Number of children: Not on file  . Years of education: Not on file  . Highest education level: Not on file  Occupational History  . Not on file  Tobacco Use  . Smoking status: Never Smoker  .  Smokeless tobacco: Never Used  Vaping Use  . Vaping Use: Never used  Substance and Sexual Activity  . Alcohol use: Yes    Comment: occasional use  . Drug use: Yes    Types: Cocaine, Marijuana    Comment: 1 gram 2-3 times weekly  . Sexual activity: Not Currently    Birth control/protection: None  Other Topics Concern  . Not on file  Social History Narrative  . 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  Allergies:  No Known Allergies  Metabolic Disorder Labs: No results found for: HGBA1C, MPG No results found for: PROLACTIN No results found for: CHOL, TRIG, HDL, CHOLHDL, VLDL, LDLCALC No results found for: TSH  Therapeutic Level Labs: No results found for: LITHIUM No results found for: CBMZ No results found for: VALPROATE  Current Medications: Current Outpatient Medications  Medication Sig Dispense Refill  . folic acid (FOLVITE) 1 MG tablet Take 1 tablet (1 mg total) by mouth daily. 30 tablet 0  . hydrOXYzine (ATARAX/VISTARIL) 10 MG tablet Take 1 tablet (10 mg total) by mouth 2 (two) times daily as needed for anxiety. 60 tablet 1  . PARoxetine (PAXIL) 10 MG tablet Take 1 tablet (10 mg total) by mouth at bedtime. 30 tablet 1  . PARoxetine (PAXIL) 20 MG tablet Take 20 mg by mouth daily.    . QUEtiapine (SEROQUEL) 100 MG tablet Take 1 tablet (100 mg total) by mouth at bedtime. 30 tablet 1  . QUEtiapine (SEROQUEL) 50 MG tablet Take 50 mg by mouth every morning.   3  . thiamine 100 MG tablet Take 1 tablet (100 mg total) by mouth daily. 30 tablet 1  . valACYclovir (VALTREX) 500 MG tablet Take 500 mg by mouth daily.     No current facility-administered medications  for this visit.    Musculoskeletal: Strength & Muscle Tone: within normal limits Gait & Station: normal Patient leans: N/A  Psychiatric Specialty Exam: Review of Systems  There were no vitals taken for this visit.There is no height or weight on file to calculate BMI.  General Appearance: Fairly Groomed  Eye Contact:  Good  Speech:  Clear and Coherent and Normal Rate  Volume:  Normal  Mood:  Anxious and Depressed  Affect:  Congruent  Thought Process:  Goal Directed and Descriptions of Associations: Intact  Orientation:  Full (Time, Place, and Person)  Thought Content:  Logical and Paranoid Ideation  Suicidal Thoughts:  No  Homicidal Thoughts:  No  Memory:  Immediate;   Good Recent;   Good Remote;   Good  Judgement:  Fair  Insight:  Fair  Psychomotor Activity:  Normal  Concentration:  Concentration: Good and Attention Span: Good  Recall:  Good  Fund of Knowledge:Good  Language: Good  Akathisia:  Negative  Handed:  Right  AIMS (if indicated): 0  Assets:  Communication Skills Desire for Improvement Financial Resources/Insurance Housing  ADL's:  Intact  Cognition: WNL  Sleep:  Poor   Screenings: PHQ2-9   Flowsheet Row ED from 04/25/2020 in [facility]  PHQ-2 Total Score 5  PHQ-9 Total Score 22      Assessment and Plan: Patient seen for evaluation after presenting as a walk-in, patient noted to be quite anxious.  He complained of depressed symptoms as well as anxiety along with paranoid ideations.  He is agreeable to be restarted on Seroquel and Paxil as these helped him immensely in the past.  He also agreed to use hydroxyzine as needed for breakthrough anxiety. Potential side effects of medication and risks vs benefits of treatment vs non-treatment were explained and discussed. All questions were answered.   1. Bipolar I disorder, most recent episode depressed (HCC) - QUEtiapine (SEROQUEL) 100 MG tablet; Take 1 tablet (100 mg total) by  mouth at bedtime.  Dispense: 30 tablet; Refill: 1 - PARoxetine (PAXIL) 10 MG tablet; Take 1 tablet (10 mg total) by mouth at bedtime.  Dispense: 30 tablet; Refill: 1  2. Anxiety - PARoxetine (PAXIL) 10 MG tablet; Take 1 tablet (10 mg total) by mouth at bedtime.  Dispense: 30 tablet; Refill: 1 - hydrOXYzine (ATARAX/VISTARIL) 10 MG tablet; Take 1 tablet (10 mg total) by mouth 2 (two) times daily as needed for anxiety.  Dispense: 60 tablet; Refill: 1  Refer for therapy. F/up in 6 weeks.   Zena Amos, MD 1/24/202210:05 AM

## 2020-06-02 ENCOUNTER — Other Ambulatory Visit: Payer: Self-pay

## 2020-06-02 ENCOUNTER — Other Ambulatory Visit (HOSPITAL_COMMUNITY): Payer: Self-pay | Admitting: Psychiatry

## 2020-06-02 ENCOUNTER — Encounter (HOSPITAL_COMMUNITY): Payer: Self-pay | Admitting: Psychiatry

## 2020-06-02 ENCOUNTER — Ambulatory Visit (INDEPENDENT_AMBULATORY_CARE_PROVIDER_SITE_OTHER): Payer: 59 | Admitting: Psychiatry

## 2020-06-02 ENCOUNTER — Ambulatory Visit (HOSPITAL_COMMUNITY): Payer: 59 | Admitting: Licensed Clinical Social Worker

## 2020-06-02 DIAGNOSIS — F419 Anxiety disorder, unspecified: Secondary | ICD-10-CM | POA: Diagnosis not present

## 2020-06-02 DIAGNOSIS — F3176 Bipolar disorder, in full remission, most recent episode depressed: Secondary | ICD-10-CM | POA: Diagnosis not present

## 2020-06-02 MED ORDER — PAROXETINE HCL 10 MG PO TABS: 10.0000 mg | ORAL_TABLET | Freq: Every day | ORAL | 1 refills | Status: DC

## 2020-06-02 MED ORDER — QUETIAPINE FUMARATE 100 MG PO TABS: 100.0000 mg | ORAL_TABLET | Freq: Every day | ORAL | 1 refills | Status: DC

## 2020-06-02 MED FILL — PAROXETINE HCL 10 MG TABS: 10 | 30 days supply | Qty: 30 | Fill #0

## 2020-06-02 MED FILL — QUETIAPINE FUMARATE 100 MG: 100 | 30 days supply | Qty: 30 | Fill #0

## 2020-06-02 NOTE — Progress Notes (Signed)
BH OP Progress Note   Patient Identification: Kyle Sweeney MRN:  [id number] Date of Evaluation:  06/02/2020     Chief Complaint:  " I feel a lot better."  Visit Diagnosis:    ICD-10-CM   1. Bipolar 1 disorder, depressed, full remission (HCC)  F31.76 PARoxetine (PAXIL) 10 MG tablet    QUEtiapine (SEROQUEL) 100 MG tablet  2. Anxiety  F41.9 PARoxetine (PAXIL) 10 MG tablet    History of Present Illness: Patient reported he feels a lot better ever since he was started back on his medications Seroquel and Paxil.  He stated that he is able to sleep well at night and then he feels less anxious and less depressed during the daytime.  He stated that his anxiety is much improved.  He stated that he is definitely much improved compared to the last visit however he still has some more work to do.  He stated that his main stressor is dealing with his ex-girlfriend because he went through a bad break-up.  He is trying to put all that behind them and move forward. 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 He denied any other concerns or issues today.  He would like to continue the same regimen for now.  He informed that he never received a prescription for hydroxyzine for breakthrough anxiety however he is okay and does not really need it as he is doing well on a combination of Seroquel and Paxil.   Past Psychiatric History: Bipolar disorder, anxiety, polysubstance abuse, used to be followed up by [facility].  He informed that he has seen more than 1 psychiatrist and therapist there and it seems like every time he would see a new provider.  Previous Psychotropic Medications: Yes Seroquel, Paxil  Substance Abuse History in the last 12 months:  Yes.  Has history of abusing cocaine and marijuana, last time the urine drug  screen was positive and April 2021 during a visit to the ED.  Consequences of Substance Abuse: Loss of wages, financial issues, poor work International aid/development worker  Past Medical History:  Past Medical History:  Diagnosis Date  . Herpes    No past surgical history on file.  Family Psychiatric History: denied  Family History: No family history on file.  Social History:   Social History   Socioeconomic History  . Marital status: Single    Spouse name: Not on file  . Number of children: Not on file  . Years of education: Not on file  . Highest education level: Not on file  Occupational History  . Not on file  Tobacco Use  . Smoking status: Never Smoker  . Smokeless tobacco: Never Used  Vaping Use  . Vaping Use: Never used  Substance and Sexual Activity  . Alcohol use: Yes    Comment: occasional use  . Drug use: Yes    Types: Cocaine, Marijuana    Comment: 1 gram 2-3 times weekly  . Sexual activity: Not Currently    Birth control/protection: None  Other Topics Concern  . Not on file  Social History Narrative  . 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  Allergies:  No Known Allergies  Metabolic Disorder Labs: No results found for: HGBA1C, MPG No results found for: PROLACTIN No results found for: CHOL, TRIG, HDL, CHOLHDL, VLDL, LDLCALC No results found for: TSH  Therapeutic Level Labs: No results found for: LITHIUM No results found for: CBMZ No results found for: VALPROATE  Current Medications: Current Outpatient Medications  Medication Sig Dispense Refill  . folic acid (FOLVITE) 1 MG tablet Take 1 tablet (1 mg total) by mouth daily. 30 tablet 0  .  PARoxetine (PAXIL) 10 MG tablet Take 1 tablet (10 mg total) by mouth at bedtime. 30 tablet 1  . PARoxetine (PAXIL) 20 MG tablet Take 20 mg by mouth daily.    . QUEtiapine (SEROQUEL) 100 MG tablet Take 1 tablet (100 mg total) by mouth at bedtime. 30 tablet 1  . thiamine 100 MG tablet Take 1 tablet (100 mg total) by mouth daily. 30 tablet 1   No current facility-administered medications for this visit.    Musculoskeletal: Strength & Muscle Tone: within normal limits Gait & Station: normal Patient leans: N/A  Psychiatric Specialty Exam: Review of Systems   Blood pressure 113/63, pulse 90, height 5\' 10"  (1.778 m), weight 169 lb (76.7 kg), SpO2 98 %.Body mass index is 24.25 kg/m.  General Appearance: Well Groomed  Eye Contact:  Good  Speech:  Clear and Coherent and Normal Rate  Volume:  Normal  Mood:  Euthymic  Affect:  Congruent  Thought Process:  Goal Directed and Descriptions of Associations: Intact  Orientation:  Full (Time, Place, and Person)  Thought Content:  Logical and Paranoid Ideation  Suicidal Thoughts:  No  Homicidal Thoughts:  No  Memory:  Immediate;   Good Recent;   Good Remote;   Good  Judgement:  Fair  Insight:  Fair  Psychomotor Activity:  Normal  Concentration:  Concentration: Good and Attention Span: Good  Recall:  Good  Fund of Knowledge:Good  Language: Good  Akathisia:  Negative  Handed:  Right  AIMS (if indicated): 0  Assets:  Communication Skills Desire for Improvement Financial Resources/Insurance Housing  ADL's:  Intact  Cognition: WNL  Sleep:  Good   Screenings: PHQ2-9   Flowsheet Row ED from 04/25/2020 in [facility]  PHQ-2 Total Score 5  PHQ-9 Total Score 22    Flowsheet Row ED from 04/25/2020 in [facility] ED from 07/08/2019 in [facility] EMERGENCY DEPARTMENT  C-SSRS RISK CATEGORY Low Risk No Risk      Assessment and Plan: Patient is doing much better compared  to his last visit after being started on the combination of medicines.  We will continue the same regimen for now.  1. Bipolar 1 disorder, depressed, full remission (HCC)  - PARoxetine (PAXIL) 10 MG tablet; Take 1 tablet (10 mg total) by mouth at bedtime.  Dispense: 30 tablet; Refill: 1 - QUEtiapine (SEROQUEL) 100 MG tablet; Take 1 tablet (100 mg total) by mouth at bedtime.  Dispense: 30 tablet; Refill: 1  2. Anxiety  - PARoxetine (PAXIL) 10 MG tablet; Take 1 tablet (10 mg total) by mouth at bedtime.  Dispense: 30 tablet; Refill: 1    He is scheduled to see the therapist today for initial evaluation. F/up in 2 months.   [facility], MD 3/2/20228:58 AM

## 2020-06-04 ENCOUNTER — Ambulatory Visit (HOSPITAL_COMMUNITY): Payer: 59 | Admitting: Psychiatry

## 2020-07-27 ENCOUNTER — Telehealth (HOSPITAL_COMMUNITY): Payer: Self-pay | Admitting: Psychiatry

## 2020-07-27 ENCOUNTER — Ambulatory Visit (INDEPENDENT_AMBULATORY_CARE_PROVIDER_SITE_OTHER): Payer: 59 | Admitting: Psychiatry

## 2020-07-27 ENCOUNTER — Other Ambulatory Visit: Payer: Self-pay

## 2020-07-27 ENCOUNTER — Encounter (HOSPITAL_COMMUNITY): Payer: Self-pay | Admitting: Psychiatry

## 2020-07-27 DIAGNOSIS — F3176 Bipolar disorder, in full remission, most recent episode depressed: Secondary | ICD-10-CM

## 2020-07-27 DIAGNOSIS — F419 Anxiety disorder, unspecified: Secondary | ICD-10-CM

## 2020-07-27 MED ORDER — QUETIAPINE FUMARATE 100 MG PO TABS
ORAL_TABLET | Freq: Every day | ORAL | 2 refills | Status: DC
  Filled 2020-07-27: qty 30, 30d supply, fill #0
  Filled 2020-09-10: qty 30, 30d supply, fill #1
  Filled 2020-10-25: qty 30, 30d supply, fill #2

## 2020-07-27 MED ORDER — PAROXETINE HCL 10 MG PO TABS
ORAL_TABLET | Freq: Every day | ORAL | 2 refills | Status: DC
  Filled 2020-07-27: qty 30, 30d supply, fill #0
  Filled 2020-09-10: qty 30, 30d supply, fill #1
  Filled 2020-10-25: qty 30, 30d supply, fill #2

## 2020-07-27 NOTE — Progress Notes (Signed)
BH OP Progress Note   Patient Identification: Kyle Sweeney MRN:  [id number] Date of Evaluation:  07/27/2020     Chief Complaint:  "I am doing fine."  Visit Diagnosis:    ICD-10-CM   1. Bipolar 1 disorder, depressed, full remission (HCC)  F31.76 PARoxetine (PAXIL) 10 MG tablet    QUEtiapine (SEROQUEL) 100 MG tablet  2. Anxiety  F41.9 PARoxetine (PAXIL) 10 MG tablet    History of Present Illness: Patient informed that he is doing well.  He stated that his work is going well.  He stated his mood is stable.  He denied having any anxiety or any depressive episodes. He stated that he is still talking to his ex-girlfriend but they are no longer together in a relationship.  He stated that he and his ex-girlfriend have mutually decided that they think to just stay away from each other so that they can focus on their own mental health issues. He informed that he got a small raise at work but other than that things are just the same. He denied any other issues or concerns today. He informed that he only had 1 tablet of both medicines left and his appointment was next week but he was not sure how to reach out to the clinic so he just presented for follow-up today.  Past Psychiatric History: Bipolar disorder, anxiety, polysubstance abuse, used to be followed up by [facility].  He informed that he has seen more than 1 psychiatrist and therapist there and it seems like every time he would see a new provider.  Previous Psychotropic Medications: Yes Seroquel, Paxil  Substance Abuse History in the last 12 months:  Yes.  Has history of abusing cocaine and marijuana, last time the urine drug screen was positive and April 2021 during a visit to the ED.  Consequences of Substance Abuse: Loss of wages, financial issues, poor work International aid/development worker  Past Medical History:  Past Medical History:  Diagnosis Date  . Herpes    No past  surgical history on file.  Family Psychiatric History: denied  Family History: No family history on file.  Social History:   Social History   Socioeconomic History  . Marital status: Single    Spouse name: Not on file  . Number of children: Not on file  . Years of education: Not on file  . Highest education level: Not on file  Occupational History  . Not on file  Tobacco Use  . Smoking status: Never Smoker  . Smokeless tobacco: Never Used  Vaping Use  . Vaping Use: Never used  Substance and Sexual Activity  . Alcohol use: Yes    Comment: occasional use  . Drug use: Yes    Types: Cocaine, Marijuana    Comment: 1 gram 2-3 times weekly  . Sexual activity: Not Currently    Birth control/protection: None  Other Topics Concern  . Not on file  Social History Narrative  . 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  Allergies:  No Known Allergies  Metabolic Disorder Labs: No results found for: HGBA1C, MPG No results found for: PROLACTIN No results found for: CHOL, TRIG, HDL, CHOLHDL,  VLDL, LDLCALC No results found for: TSH  Therapeutic Level Labs: No results found for: LITHIUM No results found for: CBMZ No results found for: VALPROATE  Current Medications: Current Outpatient Medications  Medication Sig Dispense Refill  . folic acid (FOLVITE) 1 MG tablet Take 1 tablet (1 mg total) by mouth daily. 30 tablet 0  . PARoxetine (PAXIL) 10 MG tablet TAKE 1 TABLET (10 MG TOTAL) BY MOUTH AT BEDTIME. 30 tablet 2  . QUEtiapine (SEROQUEL) 100 MG tablet TAKE 1 TABLET (100 MG TOTAL) BY MOUTH AT BEDTIME. 30 tablet 2  . thiamine 100 MG tablet Take 1 tablet (100 mg total)  by mouth daily. 30 tablet 1   No current facility-administered medications for this visit.    Musculoskeletal: Strength & Muscle Tone: within normal limits Gait & Station: normal Patient leans: N/A  Psychiatric Specialty Exam: Review of Systems   There were no vitals taken for this visit.There is no height or weight on file to calculate BMI.  General Appearance: Well Groomed  Eye Contact:  Good  Speech:  Clear and Coherent and Normal Rate  Volume:  Normal  Mood:  Euthymic  Affect:  Congruent  Thought Process:  Goal Directed and Descriptions of Associations: Intact  Orientation:  Full (Time, Place, and Person)  Thought Content:  Logical and Paranoid Ideation  Suicidal Thoughts:  No  Homicidal Thoughts:  No  Memory:  Immediate;   Good Recent;   Good Remote;   Good  Judgement:  Fair  Insight:  Fair  Psychomotor Activity:  Normal  Concentration:  Concentration: Good and Attention Span: Good  Recall:  Good  Fund of Knowledge:Good  Language: Good  Akathisia:  Negative  Handed:  Right  AIMS (if indicated): 0  Assets:  Communication Skills Desire for Improvement Financial Resources/Insurance Housing  ADL's:  Intact  Cognition: WNL  Sleep:  Good   Screenings: PHQ2-9   Flowsheet Row ED from 04/25/2020 in [facility]  PHQ-2 Total Score 5  PHQ-9 Total Score 22    Flowsheet Row ED from 04/25/2020 in [facility] ED from 07/08/2019 in [facility] EMERGENCY DEPARTMENT  C-SSRS RISK CATEGORY Low Risk No Risk      Assessment and Plan: Patient appears to be doing well on the current regimen.  1. Bipolar 1 disorder, depressed, full remission (HCC)  - PARoxetine (PAXIL) 10 MG tablet; Take 1 tablet (10 mg total) by mouth at bedtime.  Dispense: 30 tablet; Refill: 1 - QUEtiapine (SEROQUEL) 100 MG tablet; Take 1 tablet (100 mg total) by mouth at bedtime.  Dispense: 30 tablet; Refill: 1  2. Anxiety  -  PARoxetine (PAXIL) 10 MG tablet; Take 1 tablet (10 mg total) by mouth at bedtime.  Dispense: 30 tablet; Refill: 1   Continue same medication regimen. Follow up in 3 months. Patient expressed interest in being connected to a therapist in the clinic, he was connected with the front of staff.   Zena Amos, MD 4/26/202211:04 AM

## 2020-07-27 NOTE — Telephone Encounter (Signed)
Please add him to my schedule at 10:50 am and I will see him now. Thanks.

## 2020-07-27 NOTE — Telephone Encounter (Addendum)
Pt requesting scripts for SEROQUEL & PAXIL.  He says he stops by to see provider & obtains refills.  Pt is off work today. Pt says he has 1 pill left of each.

## 2020-07-28 ENCOUNTER — Other Ambulatory Visit: Payer: Self-pay

## 2020-08-04 ENCOUNTER — Ambulatory Visit (HOSPITAL_COMMUNITY): Payer: 59 | Admitting: Psychiatry

## 2020-09-10 ENCOUNTER — Other Ambulatory Visit: Payer: Self-pay

## 2020-09-15 ENCOUNTER — Ambulatory Visit (HOSPITAL_COMMUNITY): Payer: 59 | Admitting: Licensed Clinical Social Worker

## 2020-10-12 ENCOUNTER — Other Ambulatory Visit: Payer: Self-pay

## 2020-10-12 ENCOUNTER — Encounter (HOSPITAL_BASED_OUTPATIENT_CLINIC_OR_DEPARTMENT_OTHER): Payer: Self-pay | Admitting: Emergency Medicine

## 2020-10-12 ENCOUNTER — Encounter: Payer: Self-pay | Admitting: Emergency Medicine

## 2020-10-12 ENCOUNTER — Emergency Department (HOSPITAL_BASED_OUTPATIENT_CLINIC_OR_DEPARTMENT_OTHER)
Admission: EM | Admit: 2020-10-12 | Discharge: 2020-10-13 | Disposition: A | Discharge status: Home / Self Care | Payer: Self-pay | Attending: Emergency Medicine | Admitting: Emergency Medicine

## 2020-10-12 DIAGNOSIS — F1410 Cocaine abuse, uncomplicated: Secondary | ICD-10-CM | POA: Insufficient documentation

## 2020-10-12 DIAGNOSIS — X58XXXA Exposure to other specified factors, initial encounter: Secondary | ICD-10-CM | POA: Insufficient documentation

## 2020-10-12 DIAGNOSIS — T50901A Poisoning by unspecified drugs, medicaments and biological substances, accidental (unintentional), initial encounter: Secondary | ICD-10-CM | POA: Insufficient documentation

## 2020-10-12 DIAGNOSIS — F17210 Nicotine dependence, cigarettes, uncomplicated: Secondary | ICD-10-CM | POA: Insufficient documentation

## 2020-10-12 DIAGNOSIS — B009 Herpesviral infection, unspecified: Secondary | ICD-10-CM | POA: Insufficient documentation

## 2020-10-12 DIAGNOSIS — F319 Bipolar disorder, unspecified: Secondary | ICD-10-CM | POA: Insufficient documentation

## 2020-10-12 HISTORY — DX: Anxiety disorder, unspecified: F41.9

## 2020-10-12 HISTORY — DX: Other psychoactive substance abuse, uncomplicated: F19.10

## 2020-10-12 HISTORY — DX: Cocaine abuse, uncomplicated: F14.10

## 2020-10-12 HISTORY — DX: Bipolar disorder, unspecified: F31.9

## 2020-10-12 NOTE — ED Notes (Signed)
EKG reviewed by Dr. Read Drivers- updated on pt staus

## 2020-10-12 NOTE — ED Triage Notes (Addendum)
Pt reports he smoked marijuana with a stranger tonight. EMS brought him in from [place] where he was laying in the middle of the parking lot. States he remembers smoking and then taking the person to [facility] then arriving to ED. States he smokes weed regularly and is not sure why he needed to be given narcan. Alert at present but does not recall events. States he can't keep his eyes open and he feels very tired

## 2020-10-12 NOTE — ED Provider Notes (Addendum)
[facility] Provider Note: Lowella Dell, MD, FACEP  CSN: [id number] MRN: [id number] ARRIVAL: 10/12/20 at 2210 ROOM: MH08/MH08   CHIEF COMPLAINT  Drug Overdose   HISTORY OF PRESENT ILLNESS  10/12/20 10:51 PM Kyle Sweeney is a 35 y.o. male with a documented history of polysubstance abuse.  He reports that he smoked marijuana with a stranger this evening.  He was found lying in the middle of a [employer] parking lot unresponsive.  He was given Narcan with improvement.  He does not recall the events of the earlier evening apart from taking the other person to [facility].  He admits to smoking marijuana regularly but states that is all he smoked tonight.  He denies pain but states he has a "funny feeling" in his head which she has never felt after smoking marijuana in the past.   Past Medical History:  Diagnosis Date  . Anxiety   . Bipolar 1 disorder (HCC)   . Cocaine abuse (HCC)   . Herpes   . Polysubstance abuse (HCC)     History reviewed. No pertinent surgical history.  No family history on file.  Social History   Tobacco Use  . Smoking status: Every Day    Pack years: 0.00    Types: Cigarettes  . Smokeless tobacco: Never  Vaping Use  . Vaping Use: Never used  Substance Use Topics  . Alcohol use: Yes    Comment: occasional use  . Drug use: Yes    Types: Cocaine, Marijuana    Comment: pt states he does not currently use cocaine    Prior to Admission medications   Medication Sig Start Date End Date Taking? Authorizing Provider  folic acid (FOLVITE) 1 MG tablet Take 1 tablet (1 mg total) by mouth daily. 09/05/17   Arlyce Harman, DO  PARoxetine (PAXIL) 10 MG tablet TAKE 1 TABLET (10 MG TOTAL) BY MOUTH AT BEDTIME. 07/27/20 07/27/21  Zena Amos, MD  QUEtiapine (SEROQUEL) 100 MG tablet TAKE 1 TABLET (100 MG TOTAL) BY MOUTH AT BEDTIME. 07/27/20 07/27/21  Zena Amos, MD  thiamine 100 MG tablet Take 1 tablet (100 mg total) by mouth daily. 09/05/17   Arlyce Harman, DO    Allergies Patient has no known allergies.   REVIEW OF SYSTEMS  Negative except as noted here or in the History of Present Illness.   PHYSICAL EXAMINATION  Initial Vital Signs Blood pressure 129/81, pulse 91, temperature 98 F (36.7 C), temperature source Oral, resp. rate 12, height 5\' 10"  (1.778 m), weight 74.8 kg, SpO2 95 %.  Examination General: Well-developed, well-nourished male in no acute distress; appearance consistent with age of record HENT: normocephalic; atraumatic Eyes: pupils equal, round and reactive to light; extraocular muscles intact; mild conjunctival injection Neck: supple Heart: regular rate and rhythm Lungs: clear to auscultation bilaterally Abdomen: soft; nondistended; nontender; bowel sounds present Extremities: No deformity; full range of motion; pulses normal Neurologic: Drowsy but readily awakened; motor function intact in all extremities and symmetric; no facial droop Skin: Warm and dry Psychiatric: Flat affect   RESULTS  Summary of this visit's results, reviewed and interpreted by myself:   EKG Interpretation  Date/Time:  Tuesday October 12 2020 22:29:37 EDT Ventricular Rate:  88 PR Interval:  219 QRS Duration: 106 QT Interval:  389 QTC Calculation: 471 R Axis:   252 Text Interpretation: Sinus rhythm Prolonged PR interval Right atrial enlargement ST elev, probable normal early repol pattern Rate is slower Confirmed by [id number] (Paula Libra) on 10/12/2020  10:40:56 PM        Laboratory Studies: Results for orders placed or performed during the hospital encounter of 10/12/20 (from the past 24 hour(s))  Rapid urine drug screen (hospital performed)     Status: Abnormal   Collection Time: 10/13/20  3:05 AM  Result Value Ref Range   Opiates NONE DETECTED NONE DETECTED   Cocaine NONE DETECTED NONE DETECTED   Benzodiazepines NONE DETECTED NONE DETECTED   Amphetamines NONE DETECTED NONE DETECTED   Tetrahydrocannabinol POSITIVE (A)  NONE DETECTED   Barbiturates NONE DETECTED NONE DETECTED   Imaging Studies: No results found.  ED COURSE and MDM  Nursing notes, initial and subsequent vitals signs, including pulse oximetry, reviewed and interpreted by myself.  Vitals:   10/13/20 0230 10/13/20 0300 10/13/20 0330 10/13/20 0338  BP: (!) 154/80 (!) 162/82 140/90 (!) 138/95  Pulse: 86 86 84 78  Resp: 13 15  18   Temp:      TempSrc:      SpO2: 95% 94% 96% 98%  Weight:      Height:       Medications  acetaminophen (TYLENOL) tablet 1,000 mg (has no administration in time range)   3:42 AM Patient now awake and conversant.  I suspect he took an overdose of either an opioid deliberately or marijuana inadvertently laced with an opioid, likely fentanyl.  Fentanyl does not always show up in our rapid urine drug screens.   PROCEDURES  Procedures   ED DIAGNOSES     ICD-10-CM   1. Accidental drug overdose, initial encounter  T50.901A          Bonna Steury, , MD 10/13/20 0343    10/15/20, MD 10/13/20 [phone]

## 2020-10-12 NOTE — ED Notes (Signed)
Pt. Is very sleepy with eyes closed.  Pt. Reports he has no idea where he was found and why.  Pt. Brought in by EMS due to being found in pking lot after smoking weed with unknown people.  Pt. Unable to stated who and what he was doing.

## 2020-10-12 NOTE — ED Notes (Signed)
Pt. Is attempting to urinate at this time.  Pt. Having trouble with urinating and asked RN to give him time.  Sure we will RN told Pt.

## 2020-10-12 NOTE — ED Notes (Signed)
Pt. Still sleeping and has not urinated.

## 2020-10-13 LAB — RAPID URINE DRUG SCREEN, HOSP PERFORMED
Amphetamines: NOT DETECTED
Barbiturates: NOT DETECTED
Benzodiazepines: NOT DETECTED
Cocaine: NOT DETECTED
Opiates: NOT DETECTED
Tetrahydrocannabinol: POSITIVE — AB

## 2020-10-13 MED ORDER — ONDANSETRON 4 MG PO TBDP
4.0000 mg | ORAL_TABLET | Freq: Once | ORAL | Status: AC
  Administered 2020-10-13: 4 mg via ORAL

## 2020-10-13 MED ORDER — ACETAMINOPHEN 500 MG PO TABS
1000.0000 mg | ORAL_TABLET | Freq: Once | ORAL | Status: AC
  Administered 2020-10-13: 1000 mg via ORAL
  Filled 2020-10-13: qty 2

## 2020-10-13 NOTE — ED Notes (Signed)
Pt already woke up - calm and cooperative - offered drinks and crackers

## 2020-10-13 NOTE — ED Notes (Signed)
Upon entering room pt was sleeping soundly. Pt had removed all monitoring equipment. Pt awakened with touch. Pt placed back on cardiac monitor and continuous pulse ox. Pt advised he was being monitored until he was more alert and could stay awake. Pt fell back asleep.

## 2020-10-25 ENCOUNTER — Other Ambulatory Visit: Payer: Self-pay

## 2020-10-26 ENCOUNTER — Encounter (HOSPITAL_COMMUNITY): Payer: 59 | Admitting: Physician Assistant

## 2020-11-01 ENCOUNTER — Other Ambulatory Visit: Payer: Self-pay

## 2021-02-27 ENCOUNTER — Emergency Department (HOSPITAL_COMMUNITY)
Admission: EM | Admit: 2021-02-27 | Discharge: 2021-02-27 | Discharge status: Against Medical Advice | Payer: Self-pay | Attending: Emergency Medicine | Admitting: Emergency Medicine

## 2021-02-27 ENCOUNTER — Other Ambulatory Visit (HOSPITAL_COMMUNITY)
Admission: EM | Admit: 2021-02-27 | Discharge: 2021-02-27 | Disposition: A | Discharge status: Other Acute Inpatient Hospital | Payer: No Payment, Other | Attending: Behavioral Health | Admitting: Behavioral Health

## 2021-02-27 ENCOUNTER — Inpatient Hospital Stay (HOSPITAL_COMMUNITY)
Admission: EM | Admit: 2021-02-27 | Discharge: 2021-03-01 | DRG: 281 | Disposition: A | Discharge status: Home / Self Care | Payer: Self-pay | Attending: Internal Medicine | Admitting: Internal Medicine

## 2021-02-27 DIAGNOSIS — R7989 Other specified abnormal findings of blood chemistry: Secondary | ICD-10-CM

## 2021-02-27 DIAGNOSIS — R45851 Suicidal ideations: Secondary | ICD-10-CM | POA: Diagnosis not present

## 2021-02-27 DIAGNOSIS — Z20822 Contact with and (suspected) exposure to covid-19: Secondary | ICD-10-CM | POA: Diagnosis not present

## 2021-02-27 DIAGNOSIS — F17210 Nicotine dependence, cigarettes, uncomplicated: Secondary | ICD-10-CM | POA: Diagnosis present

## 2021-02-27 DIAGNOSIS — Z79899 Other long term (current) drug therapy: Secondary | ICD-10-CM

## 2021-02-27 DIAGNOSIS — R45850 Homicidal ideations: Secondary | ICD-10-CM | POA: Insufficient documentation

## 2021-02-27 DIAGNOSIS — F1910 Other psychoactive substance abuse, uncomplicated: Secondary | ICD-10-CM | POA: Diagnosis present

## 2021-02-27 DIAGNOSIS — R451 Restlessness and agitation: Secondary | ICD-10-CM | POA: Diagnosis present

## 2021-02-27 DIAGNOSIS — I2510 Atherosclerotic heart disease of native coronary artery without angina pectoris: Secondary | ICD-10-CM | POA: Diagnosis present

## 2021-02-27 DIAGNOSIS — F1490 Cocaine use, unspecified, uncomplicated: Secondary | ICD-10-CM

## 2021-02-27 DIAGNOSIS — R778 Other specified abnormalities of plasma proteins: Secondary | ICD-10-CM | POA: Insufficient documentation

## 2021-02-27 DIAGNOSIS — R9431 Abnormal electrocardiogram [ECG] [EKG]: Secondary | ICD-10-CM

## 2021-02-27 DIAGNOSIS — Z9151 Personal history of suicidal behavior: Secondary | ICD-10-CM | POA: Insufficient documentation

## 2021-02-27 DIAGNOSIS — I214 Non-ST elevation (NSTEMI) myocardial infarction: Principal | ICD-10-CM | POA: Diagnosis present

## 2021-02-27 DIAGNOSIS — F1210 Cannabis abuse, uncomplicated: Secondary | ICD-10-CM | POA: Diagnosis present

## 2021-02-27 DIAGNOSIS — F1410 Cocaine abuse, uncomplicated: Secondary | ICD-10-CM | POA: Diagnosis present

## 2021-02-27 DIAGNOSIS — F319 Bipolar disorder, unspecified: Secondary | ICD-10-CM | POA: Diagnosis present

## 2021-02-27 LAB — CBC WITH DIFFERENTIAL/PLATELET
Abs Immature Granulocytes: 0.04 10*3/uL (ref 0.00–0.07)
Basophils Absolute: 0 10*3/uL (ref 0.0–0.1)
Basophils Relative: 0 %
Eosinophils Absolute: 0 10*3/uL (ref 0.0–0.5)
Eosinophils Relative: 0 %
HCT: 44.3 % (ref 39.0–52.0)
Hemoglobin: 14.8 g/dL (ref 13.0–17.0)
Immature Granulocytes: 0 %
Lymphocytes Relative: 20 %
Lymphs Abs: 2.2 10*3/uL (ref 0.7–4.0)
MCH: 29.5 pg (ref 26.0–34.0)
MCHC: 33.4 g/dL (ref 30.0–36.0)
MCV: 88.4 fL (ref 80.0–100.0)
Monocytes Absolute: 0.7 10*3/uL (ref 0.1–1.0)
Monocytes Relative: 7 %
Neutro Abs: 8.1 10*3/uL — ABNORMAL HIGH (ref 1.7–7.7)
Neutrophils Relative %: 73 %
Platelets: 235 10*3/uL (ref 150–400)
RBC: 5.01 MIL/uL (ref 4.22–5.81)
RDW: 12.6 % (ref 11.5–15.5)
WBC: 11.1 10*3/uL — ABNORMAL HIGH (ref 4.0–10.5)
nRBC: 0 % (ref 0.0–0.2)

## 2021-02-27 LAB — ETHANOL: Alcohol, Ethyl (B): <10 mg/dL (ref <10)

## 2021-02-27 LAB — POC SARS CORONAVIRUS 2 AG -  ED
SARS Coronavirus 2 Ag: NEGATIVE
SARSCOV2ONAVIRUS 2 AG: NEGATIVE

## 2021-02-27 LAB — LIPID PANEL
Cholesterol: 145 mg/dL (ref 0–200)
HDL: 80 mg/dL (ref >40)
LDL Cholesterol: 59 mg/dL (ref 0–99)
Total CHOL/HDL Ratio: 1.8 RATIO
Triglycerides: 29 mg/dL (ref <150)
VLDL: 6 mg/dL (ref 0–40)

## 2021-02-27 LAB — HIV ANTIBODY (ROUTINE TESTING W REFLEX): HIV Screen 4th Generation wRfx: NONREACTIVE

## 2021-02-27 LAB — TSH: TSH: 0.599 u[IU]/mL (ref 0.350–4.500)

## 2021-02-27 LAB — COMPREHENSIVE METABOLIC PANEL
ALT: 34 U/L (ref 0–44)
AST: 41 U/L (ref 15–41)
Albumin: 3.6 g/dL (ref 3.5–5.0)
Alkaline Phosphatase: 43 U/L (ref 38–126)
Anion gap: 9 (ref 5–15)
BUN: 8 mg/dL (ref 6–20)
CO2: 24 mmol/L (ref 22–32)
Calcium: 9.3 mg/dL (ref 8.9–10.3)
Chloride: 105 mmol/L (ref 98–111)
Creatinine, Ser: 1.08 mg/dL (ref 0.61–1.24)
GFR, Estimated: >60 mL/min (ref >60)
Glucose, Bld: 80 mg/dL (ref 70–99)
Potassium: 4 mmol/L (ref 3.5–5.1)
Sodium: 138 mmol/L (ref 135–145)
Total Bilirubin: 0.6 mg/dL (ref 0.3–1.2)
Total Protein: 6.2 g/dL — ABNORMAL LOW (ref 6.5–8.1)

## 2021-02-27 LAB — POCT URINE DRUG SCREEN - MANUAL ENTRY (I-SCREEN)
POC Amphetamine UR: NOT DETECTED
POC Buprenorphine (BUP): NOT DETECTED
POC Cocaine UR: POSITIVE — AB
POC Marijuana UR: POSITIVE — AB
POC Methadone UR: NOT DETECTED
POC Methamphetamine UR: NOT DETECTED
POC Morphine: NOT DETECTED
POC Oxazepam (BZO): NOT DETECTED
POC Oxycodone UR: NOT DETECTED
POC Secobarbital (BAR): NOT DETECTED

## 2021-02-27 LAB — RESP PANEL BY RT-PCR (FLU A&B, COVID) ARPGX2
Influenza A by PCR: NEGATIVE
Influenza B by PCR: NEGATIVE
SARS Coronavirus 2 by RT PCR: NEGATIVE

## 2021-02-27 MED ORDER — ALUM & MAG HYDROXIDE-SIMETH 200-200-20 MG/5ML PO SUSP: 30.0000 mL | ORAL | Status: DC | PRN

## 2021-02-27 MED ORDER — ASPIRIN 81 MG PO CHEW
CHEWABLE_TABLET | ORAL | Status: AC
  Administered 2021-02-27: 17:00:00 81 mg
  Filled 2021-02-27: qty 1

## 2021-02-27 MED ORDER — QUETIAPINE FUMARATE 50 MG PO TABS: 50.0000 mg | ORAL_TABLET | Freq: Every day | ORAL | Status: DC

## 2021-02-27 MED ORDER — ACETAMINOPHEN 325 MG PO TABS: 650.0000 mg | ORAL_TABLET | Freq: Four times a day (QID) | ORAL | Status: DC | PRN

## 2021-02-27 MED ORDER — HYDROXYZINE HCL 25 MG PO TABS: 50.0000 mg | ORAL_TABLET | Freq: Three times a day (TID) | ORAL | Status: DC | PRN

## 2021-02-27 MED ORDER — MAGNESIUM HYDROXIDE 400 MG/5ML PO SUSP: 30.0000 mL | Freq: Every day | ORAL | Status: DC | PRN

## 2021-02-27 MED ORDER — FOLIC ACID 1 MG PO TABS: 1.0000 mg | ORAL_TABLET | Freq: Every day | ORAL | Status: DC

## 2021-02-27 MED ORDER — THIAMINE HCL 100 MG PO TABS: 100.0000 mg | ORAL_TABLET | Freq: Every day | ORAL | Status: DC

## 2021-02-27 MED ORDER — PAROXETINE HCL 10 MG PO TABS: 10.0000 mg | ORAL_TABLET | Freq: Every day | ORAL | Status: DC

## 2021-02-27 MED ORDER — TRAZODONE HCL 50 MG PO TABS: 50.0000 mg | ORAL_TABLET | Freq: Every evening | ORAL | Status: DC | PRN

## 2021-02-27 NOTE — ED Notes (Signed)
[facility] called to pick up STAT specimens and to deliver to [facility]. °

## 2021-02-27 NOTE — ED Notes (Signed)
Pt called multiple times for triage with no answer. EMT states they have been calling this pt for vitals with no answer.

## 2021-02-27 NOTE — Progress Notes (Signed)
Pt transferred to [facility] due to chest pain. Pt was transferred via  [facility].

## 2021-02-27 NOTE — ED Provider Notes (Addendum)
Behavioral Health Urgent Care Medical Screening Exam  Date: 02/27/21 Patient Name: Kyle Sweeney MRN: [id number] Chief Complaint:  Chief Complaint  Patient presents with   Suicidal      Diagnoses:  Final diagnoses:  Suicidal ideation    HPI: Patient presents to the [facility] voluntarily as a walk-in unaccompanied with a chief complaint of "suicidal ideations with a plan to slit his throat."   Patient seen and evaluated face-to-face by this provider, chart reviewed and case discussed with Dr. Gasper Sells. On evaluation, patient is alert and oriented x4. His thought process is logical and speech is coherent. His mood is dysphoric and affect is congruent. He appears well groomed and casually dressed.  He reports that he is in a major crisis and is feeling suicidal, depressed and anxious. He reports that he woke up this morning and the feeling was unbearable. He endorses suicidal ideations with a plan to slit his throat. He is unable to contract for safety at this time. He reports one previous suicide attempt last year via [facility]. He denies self-harm behaviors. He endorses homicidal ideations towards no one specific. He reports feeling paranoid like everyone is out to get him or wants to see him do bad. He denies auditory or visual hallucinations. There is no evidence that he is responding to internal or external stimuli. He reports feeling depressed and describes his depressive symptoms as sadness, worthlessness, hopelessness, decreased energy level, and a lack of interest in trying to do the things that he enjoys doing such as going to the gym. He reports poor sleep due to waking up in the middle of the night. He reports a "minimal" appetite. He reports that he uses marijuana twice a week on average a gram a day when he does use. He reports that he experiences paranoia thoughts even when he does not smoke marijuana. He reports drinking alcohol and states that he  drinks 2-3 times a week, a combination of liquor and beer. He denies a history of withdrawal symptoms or DTs.  Patient states that he resides with his sister and her 2 children. He works full-time as a Scientist, water quality.  He reports that he was following up here at the [facility] for outpatient medication management, but has not followed up in the past 6 months. He states that he was previously prescribed Paxil and Seroquel.  He states that he has been off his medications for at least 6 to 8 months. He denies a past history of psychiatric hospitalizations. He denies a medical history.  He states that is not currently prescribed any medications.   Total Time spent with patient: 30 minutes  Musculoskeletal  Strength & Muscle Tone: within normal limits Gait & Station: normal Patient leans: N/A  Psychiatric Specialty Exam  Presentation General Appearance: Appropriate for Environment; Casual  Eye Contact:Fair  Speech:Clear and Coherent  Speech Volume:Normal  Handedness:No data recorded  Mood and Affect  Mood:Dysphoric  Affect:Congruent   Thought Process  Thought Processes:Coherent; Goal Directed  Descriptions of Associations:Intact  Orientation:Full (Time, Place and Person)  Thought Content:Logical  Diagnosis of Schizophrenia or Schizoaffective disorder in past: No   Hallucinations:Hallucinations: None  Ideas of Reference:Paranoia  Suicidal Thoughts:Suicidal Thoughts: Yes, Active SI Active Intent and/or Plan: With Intent; With Plan  Homicidal Thoughts:Homicidal Thoughts: Yes, Passive HI Passive Intent and/or Plan: Without Intent; Without Plan   Sensorium  Memory:Immediate Fair; Recent Fair; Remote Fair  Judgment:Fair  Insight:Fair   Executive Functions  Concentration:Fair  Attention Span:Fair  Recall:Fair  Fund of Knowledge:Fair  Language:Fair   Psychomotor Activity  Psychomotor Activity:Psychomotor Activity: Normal   Assets  Assets:Communication Skills;  Desire for Improvement; Financial Resources/Insurance; Housing; Leisure Time; Physical Health; Social Support; Talents/Skills; Resilience; Transportation   Sleep  Sleep:Sleep: Poor   Physical Exam Constitutional:      Appearance: Normal appearance.  HENT:     Head: Normocephalic.     Nose: Nose normal.  Eyes:     Conjunctiva/sclera: Conjunctivae normal.  Cardiovascular:     Rate and Rhythm: Normal rate.  Pulmonary:     Effort: Pulmonary effort is normal.  Musculoskeletal:        General: Normal range of motion.     Cervical back: Normal range of motion.  Neurological:     Mental Status: He is alert and oriented to person, place, and time.   Review of Systems  Constitutional: Negative.   HENT: Negative.    Eyes: Negative.   Respiratory: Negative.    Cardiovascular: Negative.   Gastrointestinal: Negative.   Genitourinary: Negative.   Musculoskeletal: Negative.   Skin: Negative.   Neurological: Negative.   Endo/Heme/Allergies: Negative.    Blood pressure (!) 144/87, pulse 96, temperature 99.1 F (37.3 C), temperature source Oral, resp. rate 18, SpO2 98 %. There is no height or weight on file to calculate BMI.  Past Psychiatric History: Bipolar disorder, anxiety, polysubstance abuse, used to be followed up by [facility]. Previously prescribed Paxil and Seroquel.  Past Medical History:  Past Medical History:  Diagnosis Date   Anxiety    Bipolar 1 disorder (HCC)    Cocaine abuse (HCC)    Herpes    Polysubstance abuse (HCC)    No past surgical history on file.  Family History: Patient reports no family hx.   Social History:  Social History   Socioeconomic History   Marital status: Single    Spouse name: Not on file   Number of children: Not on file   Years of education: Not on file   Highest education level: Not on file  Occupational History   Not on file  Tobacco Use   Smoking status: Every Day    Types: Cigarettes   Smokeless tobacco:  Never  Vaping Use   Vaping Use: Never used  Substance and Sexual Activity   Alcohol use: Yes    Comment: occasional use   Drug use: Yes    Types: Cocaine, Marijuana    Comment: pt states he does not currently use cocaine   Sexual activity: Not Currently    Birth control/protection: None  Other Topics Concern   Not on file  Social History Narrative   Not on file   Social Determinants of Health   Financial Resource Strain: Not on file  Food Insecurity: Not on file  Transportation Needs: Not on file  Physical Activity: Not on file  Stress: Not on file  Social Connections: Not on file  Intimate Partner Violence: Not on file    SDOH:  SDOH Screenings   Alcohol Screen: Not on file  Depression (PHQ2-9): Medium Risk   PHQ-2 Score: 22  Financial Resource Strain: Not on file  Food Insecurity: Not on file  Housing: Not on file  Physical Activity: Not on file  Social Connections: Not on file  Stress: Not on file  Tobacco Use: High Risk   Smoking Tobacco Use: Every Day   Smokeless Tobacco Use: Never   Passive Exposure: Not on file  Transportation Needs: Not on file  Last Labs:  Admission on 10/12/2020, Discharged on 10/13/2020  Component Date Value Ref Range Status   Opiates 10/13/2020 NONE DETECTED  NONE DETECTED Final   Cocaine 10/13/2020 NONE DETECTED  NONE DETECTED Final   Benzodiazepines 10/13/2020 NONE DETECTED  NONE DETECTED Final   Amphetamines 10/13/2020 NONE DETECTED  NONE DETECTED Final   Tetrahydrocannabinol 10/13/2020 POSITIVE (A)  NONE DETECTED Final   Barbiturates 10/13/2020 NONE DETECTED  NONE DETECTED Final   Comment: (NOTE) DRUG SCREEN FOR MEDICAL PURPOSES ONLY.  IF CONFIRMATION IS NEEDED FOR ANY PURPOSE, NOTIFY LAB WITHIN 5 DAYS.  LOWEST DETECTABLE LIMITS FOR URINE DRUG SCREEN Drug Class                     Cutoff (ng/mL) Amphetamine and metabolites    1000 Barbiturate and metabolites    200 Benzodiazepine                 200 Tricyclics and  metabolites     300 Opiates and metabolites        300 Cocaine and metabolites        300 THC                            50 Performed at [facility], [address] [place], [place] [address]     Allergies: Patient has no known allergies.  PTA Medications: (Not in a hospital admission)   Medical Decision Making  Patient initially admitted to the [facility] crisis for mood stabilization and safety. Patient was sent to the [facility] by Hillery Jacks, NP., for medical clearance after EKG showed MI/STEMI. Hillery Jacks, NP called report to Dr. Rayfield Citizen at [facility] Patient sent via EMS. Patient may return to the [facility] once medically cleared. Patient is voluntary.  Labs ordered:   Lab Orders         Resp Panel by RT-PCR (Flu A&B, Covid) Nasopharyngeal Swab         CBC with Differential/Platelet         Comprehensive metabolic panel         Hemoglobin A1c         Ethanol         Lipid panel         TSH         RPR         HIV Antibody (routine testing w rflx)         POCT Urine Drug Screen - ([facility])         POC SARS Coronavirus 2 Ag-ED - Nasal Swab    EKG   Recommendations  Based on my evaluation the patient does not appear to have an emergency medical condition.  Layla Barter, NP 02/27/21  4:29 PM

## 2021-02-27 NOTE — ED Notes (Signed)
Pt was called multiple times and no response for triage

## 2021-02-27 NOTE — BH Assessment (Signed)
Kyle Sweeney, Urgent,35 years old presents this date unaccompanied.  Pt reports SI with a plan to cut his neck.  Pt denies HI or AVH. Pt admits to prior MH diagnosis or prescribed medication for symptom management.  MSE signed by patient.

## 2021-02-27 NOTE — Discharge Instructions (Signed)
Take all medications as prescribed. Keep all follow-up appointments as scheduled.  Do not consume alcohol or use illegal drugs while on prescription medications. Report any adverse effects from your medications to your primary care provider promptly.  In the event of recurrent symptoms or worsening symptoms, call 911, a crisis hotline, or go to the nearest emergency department for evaluation.   

## 2021-02-27 NOTE — ED Provider Notes (Signed)
Patient apparently was transferred from [facility] for SI.  He was sent over here due to abnormal EKG and medication clearance.  EMS had placed patient in waiting room prior to being triaged by nursing staff.  Patient was called multiple times for triage, however he appears to have eloped from the emergency department prior to assessment by nursing and provider.  I did not personally evaluate patient or participate in his care. He eloped from [facility] prior to assessment.  He was NOT under IVC per Psych note   Linwood Dibbles, PA-C 02/27/21 1845    Terald Sleeper, MD 03/10/21 [phone]

## 2021-02-27 NOTE — ED Triage Notes (Signed)
Pt bib ems, went to [facility] for SI. [facility] did an EKG which read STEMI, ems called to transport here for clearance. 12 lead shows LVH. Pt denies CP, SHOB.
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
## 2021-02-28 ENCOUNTER — Other Ambulatory Visit: Payer: Self-pay

## 2021-02-28 ENCOUNTER — Encounter (HOSPITAL_COMMUNITY): Payer: Self-pay | Admitting: *Deleted

## 2021-02-28 ENCOUNTER — Inpatient Hospital Stay (HOSPITAL_COMMUNITY): Payer: Self-pay

## 2021-02-28 DIAGNOSIS — I214 Non-ST elevation (NSTEMI) myocardial infarction: Secondary | ICD-10-CM | POA: Diagnosis present

## 2021-02-28 DIAGNOSIS — R079 Chest pain, unspecified: Secondary | ICD-10-CM

## 2021-02-28 LAB — CBC
HCT: 45.3 % (ref 39.0–52.0)
Hemoglobin: 14.4 g/dL (ref 13.0–17.0)
MCH: 29.4 pg (ref 26.0–34.0)
MCHC: 31.8 g/dL (ref 30.0–36.0)
MCV: 92.6 fL (ref 80.0–100.0)
Platelets: 218 10*3/uL (ref 150–400)
RBC: 4.89 MIL/uL (ref 4.22–5.81)
RDW: 12.8 % (ref 11.5–15.5)
WBC: 10.6 10*3/uL — ABNORMAL HIGH (ref 4.0–10.5)
nRBC: 0 % (ref 0.0–0.2)

## 2021-02-28 LAB — ECHOCARDIOGRAM COMPLETE
AR max vel: 2.67 cm2
AV Area VTI: 2.64 cm2
AV Area mean vel: 2.58 cm2
AV Mean grad: 4 mmHg
AV Peak grad: 7 mmHg
Ao pk vel: 1.32 m/s
Area-P 1/2: 3.72 cm2
Height: 70 in
S' Lateral: 2.8 cm
Weight: 2638.47 oz

## 2021-02-28 LAB — RPR: RPR Ser Ql: NONREACTIVE

## 2021-02-28 LAB — RAPID URINE DRUG SCREEN, HOSP PERFORMED
Amphetamines: NOT DETECTED
Barbiturates: NOT DETECTED
Benzodiazepines: NOT DETECTED
Cocaine: POSITIVE — AB
Opiates: NOT DETECTED
Tetrahydrocannabinol: POSITIVE — AB

## 2021-02-28 LAB — TROPONIN I (HIGH SENSITIVITY)
Troponin I (High Sensitivity): 61 ng/L — ABNORMAL HIGH (ref <18)
Troponin I (High Sensitivity): 65 ng/L — ABNORMAL HIGH (ref <18)
Troponin I (High Sensitivity): 56 ng/L — ABNORMAL HIGH (ref <18)
Troponin I (High Sensitivity): 44 ng/L — ABNORMAL HIGH (ref <18)

## 2021-02-28 LAB — BASIC METABOLIC PANEL
Anion gap: 9 (ref 5–15)
BUN: 8 mg/dL (ref 6–20)
CO2: 24 mmol/L (ref 22–32)
Calcium: 9.1 mg/dL (ref 8.9–10.3)
Chloride: 104 mmol/L (ref 98–111)
Creatinine, Ser: 1.16 mg/dL (ref 0.61–1.24)
GFR, Estimated: >60 mL/min (ref >60)
Glucose, Bld: 84 mg/dL (ref 70–99)
Potassium: 4.4 mmol/L (ref 3.5–5.1)
Sodium: 137 mmol/L (ref 135–145)

## 2021-02-28 LAB — GC/CHLAMYDIA PROBE AMP (~~LOC~~) NOT AT ARMC
Chlamydia: NEGATIVE
Comment: NEGATIVE
Comment: NORMAL
Neisseria Gonorrhea: NEGATIVE

## 2021-02-28 LAB — HEMOGLOBIN A1C
Hgb A1c MFr Bld: 5.5 % (ref 4.8–5.6)
Mean Plasma Glucose: 111 mg/dL

## 2021-02-28 LAB — HEPARIN LEVEL (UNFRACTIONATED): Heparin Unfractionated: 0.31 IU/mL (ref 0.30–0.70)

## 2021-02-28 MED ORDER — QUETIAPINE FUMARATE 50 MG PO TABS
50.0000 mg | ORAL_TABLET | Freq: Every day | ORAL | Status: DC
  Administered 2021-03-01: 50 mg via ORAL

## 2021-02-28 MED ORDER — ACETAMINOPHEN 325 MG PO TABS: 650.0000 mg | ORAL_TABLET | ORAL | Status: DC | PRN

## 2021-02-28 MED ORDER — ATORVASTATIN CALCIUM 10 MG PO TABS
20.0000 mg | ORAL_TABLET | Freq: Every day | ORAL | Status: DC
  Administered 2021-02-28 – 2021-03-01 (×2): 20 mg via ORAL
  Filled 2021-02-28 (×2): qty 2

## 2021-02-28 MED ORDER — HEPARIN (PORCINE) 25000 UT/250ML-% IV SOLN
1100.0000 [IU]/h | INTRAVENOUS | Status: DC
  Administered 2021-02-28: 06:00:00 1050 [IU]/h via INTRAVENOUS
  Administered 2021-03-01: 1100 [IU]/h via INTRAVENOUS
  Filled 2021-02-28 (×2): qty 250

## 2021-02-28 MED ORDER — DILTIAZEM HCL 30 MG PO TABS
30.0000 mg | ORAL_TABLET | Freq: Once | ORAL | Status: AC
Start: 2021-02-28 — End: 2021-02-28
  Administered 2021-02-28: 13:00:00 30 mg via ORAL
  Filled 2021-02-28: qty 1

## 2021-02-28 MED ORDER — NITROGLYCERIN 0.4 MG SL SUBL
SUBLINGUAL_TABLET | SUBLINGUAL | Status: AC
  Administered 2021-02-28: 12:00:00 0.8 mg
  Filled 2021-02-28: qty 2

## 2021-02-28 MED ORDER — ONDANSETRON HCL 4 MG/2ML IJ SOLN: 4.0000 mg | Freq: Four times a day (QID) | INTRAMUSCULAR | Status: DC | PRN

## 2021-02-28 MED ORDER — HEPARIN BOLUS VIA INFUSION
4000.0000 [IU] | Freq: Once | INTRAVENOUS | Status: AC
  Administered 2021-02-28: 06:00:00 4000 [IU] via INTRAVENOUS
  Filled 2021-02-28: qty 4000

## 2021-02-28 MED ORDER — ASPIRIN EC 81 MG PO TBEC
81.0000 mg | DELAYED_RELEASE_TABLET | Freq: Every day | ORAL | Status: DC
  Administered 2021-02-28 – 2021-03-01 (×2): 81 mg via ORAL
  Filled 2021-02-28 (×2): qty 1

## 2021-02-28 MED ORDER — QUETIAPINE FUMARATE 50 MG PO TABS: 100.0000 mg | ORAL_TABLET | Freq: Every day | ORAL | Status: DC

## 2021-02-28 MED ORDER — QUETIAPINE FUMARATE 50 MG PO TABS
50.0000 mg | ORAL_TABLET | Freq: Every day | ORAL | Status: DC
  Filled 2021-02-28 (×2): qty 1

## 2021-02-28 MED ORDER — IOHEXOL 350 MG/ML SOLN
95.0000 mL | Freq: Once | INTRAVENOUS | Status: AC | PRN
  Administered 2021-02-28: 13:00:00 95 mL via INTRAVENOUS

## 2021-02-28 MED ORDER — LACTATED RINGERS IV SOLN: INTRAVENOUS | Status: DC

## 2021-02-28 MED ORDER — ESCITALOPRAM OXALATE 10 MG PO TABS
10.0000 mg | ORAL_TABLET | Freq: Every day | ORAL | Status: DC
  Administered 2021-02-28 – 2021-03-01 (×2): 10 mg via ORAL
  Filled 2021-02-28 (×2): qty 1

## 2021-02-28 MED ORDER — TRAZODONE HCL 100 MG PO TABS: 100.0000 mg | ORAL_TABLET | Freq: Every evening | ORAL | Status: DC | PRN

## 2021-02-28 NOTE — Progress Notes (Signed)
ANTICOAGULATION CONSULT NOTE - Initial Consult  Pharmacy Consult for Heparin Indication: chest pain/ACS  No Known Allergies  Patient Measurements: Height: 5\' 10"  (177.8 cm) Weight: 74.8 kg (164 lb 14.5 oz) IBW/kg (Calculated) : 73 Heparin Dosing Weight: 75 kg  Vital Signs: BP: 124/90 (11/28 1500) Pulse Rate: 72 (11/28 1500)  Labs: Recent Labs    02/27/21 1626 02/28/21 0149 02/28/21 0358 02/28/21 0549 02/28/21 0915 02/28/21 1200  HGB 14.8  --   --  14.4  --   --   HCT 44.3  --   --  45.3  --   --   PLT 235  --   --  218  --   --   HEPARINUNFRC  --   --   --   --   --  0.31  CREATININE 1.08  --   --  1.16  --   --   TROPONINIHS  --    < > 65* 56* 44*  --    < > = values in this interval not displayed.     Estimated Creatinine Clearance: 90.9 mL/min (by C-G formula based on SCr of 1.16 mg/dL).   Medical History: Past Medical History:  Diagnosis Date   Anxiety    Bipolar 1 disorder (HCC)    Cocaine abuse (HCC)    Herpes    Polysubstance abuse (HCC)     Medications:  See electronic med rec  Assessment: 35 y.o. M transferred from [name] for abnormal EKG. No AC PTA. CBC ok on admission.Pharmacy consulted begin heparin for ACS.   Heparin level 0.31, therapeutic (on lower end of range) CBC stable. Infusion running appropriately. No overt s/s of bleeding.  Goal of Therapy:  Heparin level 0.3-0.7 units/ml Monitor platelets by anticoagulation protocol: Yes   Plan:  Increase Heparin gtt at 1100 units/hr Will f/u heparin level in 6 hours Daily heparin level and CBC  Thank you for allowing pharmacy to participate in this patient's care.  [facility], PharmD PGY1 Acute Care Resident  02/28/2021,3:25 PM

## 2021-02-28 NOTE — ED Notes (Signed)
Breakfast orders placed 

## 2021-02-28 NOTE — Progress Notes (Signed)
PROGRESS NOTE    Kyle Sweeney  RJJ:[id number] DOB: 09/26/1985 DOA: 02/27/2021 PCP: Patient, No Pcp Per (Inactive)     Brief Narrative:   Kyle Sweeney is a 35 y.o. BM PMHx Bipolar disorder, polysubstance abuse who went to behavioral health yesterday for suicidal ideation and was found to have tachycardia and an abnormal EKG so he was sent to the emergency room for further evaluation.  In the emergency room he had elevated troponin levels of 60.  He denies any chest pain, shortness of breath nausea vomiting or diarrhea.  He states he feels well and has no physical complaints.  He reports he does not have any suicidal ideation at this time but does admit that the thoughts come and go when they come they can become very overwhelming.  He has been on Paxil and Seroquel in the past but has not been on medication for the last 6 months.  When he had significant thoughts of committing suicide yesterday he went to [facility] health for assistance resumed on medications. To smoking 2 packs of cigarettes this week and occasionally smokes marijuana.  He does admit that he smokes marijuana 2 days ago.  He states he did not use any cocaine but he has in the past.  He is not sure if the marijuana that he was given was laced with cocaine.   ED Course: [name] has been hemodynamically stable in the emergency room.  Found to have an abnormal EKG and had elevated troponin levels of 61 and 65.  CBC CMP were unremarkable.  Lipid panel showed cholesterol of 145 HDL of 80 LDL 59 triglycerides of 29.  TSH 0.599.  COVID test negative.  HIV test negative.  Alcohol level negative.  UDS positive for cocaine and marijuana   Subjective: Patient seen today by another [facility] physician no charge   Assessment & Plan: Covid vaccination; vaccinated 2/3   Principal Problem:   NSTEMI (non-ST elevated myocardial infarction) [facility]) Active Problems:   Polysubstance abuse (HCC)   Bipolar 1 disorder (HCC)   Suicidal ideations    NSTEMI (non-ST elevated myocardial infarction)  Mr. Chevere admitted on cardiac telemetry.  Obtain serial troponin levels.  Opponent level is elevated and will consult cardiology for further evaluation.  Patient may require further testing or catheterization.  Antiplatelet therapy with aspirin daily.  Check lipid panel.  Start statin therapy.  Monitor blood pressure.  Nitroglycerin as needed.  Supplemental oxygen as needed to maintain O2 sat between 92-96% Beta-blockers not given with cocaine on UDS -11/28 discussed case with Dr.Mahesh [facility] cardiology who stated they had a couple more tests to run on patient but believed that they would be negative and patient will be clear for discharge in the a.m.    Active Problems:   Polysubstance abuse  Patient with history of marijuana use.  He has history of cocaine use in the past but states has not used recently.  He is not sure if some of the marijuana he smoked recently was laced with cocaine.     Bipolar 1 disorder Has been off his medications for the last 6 months.  Resume SSRI therapy with Lexapro.  Resume Seroquel at 50 mg a day.  Patient will need psychiatry consult in the morning once medically stable     Suicidal ideations Patient reports having suicidal thoughts and did have a plan to cut himself yesterday morning.  He states he is not thinking hurting himself at this time but does admit that the thoughts  will come and go.  He went to behavioral health for evaluation and help yesterday and was found to have tachycardia and abnormal EKG so sent to the emergency room.  Patient placed on suicide precautions and safety sitter ordered          DVT prophylaxis: Heparin drip Code Status: Full Family Communication:  Status is: Inpatient    Dispo: The patient is from: Home              Anticipated d/c is to:  Inpatient psychiatric facility              Anticipated d/c date is: 1 day              Patient currently is not medically  stable to d/c.      Consultants:  Dr.Mahesh Izora Ribas cardiology  Procedures/Significant Events:    I have personally reviewed and interpreted all radiology studies and my findings are as above.  VENTILATOR SETTINGS:    Cultures   Antimicrobials:    Devices    LINES / TUBES:      Continuous Infusions:  heparin 1,050 Units/hr (02/28/21 0557)   lactated ringers 75 mL/hr at 02/28/21 0558     Objective: Vitals:   02/28/21 1130 02/28/21 1200 02/28/21 1313 02/28/21 1330  BP: 119/74 124/78 128/85 136/87  Pulse: 69 68  66  Resp: (!) 25 19  (!) 23  Temp:      TempSrc:      SpO2: 99% 100%  99%  Weight:      Height:       No intake or output data in the 24 hours ending 02/28/21 1401 Filed Weights   02/28/21 0201  Weight: 74.8 kg    Examination:  Patient seen today by another [facility] physician no charge .     Data Reviewed: Care during the described time interval was provided by me .  I have reviewed this patient's available data, including medical history, events of note, physical examination, and all test results as part of my evaluation.  CBC: Recent Labs  Lab 02/27/21 1626 02/28/21 0549  WBC 11.1* 10.6*  NEUTROABS 8.1*  --   HGB 14.8 14.4  HCT 44.3 45.3  MCV 88.4 92.6  PLT 235 218   Basic Metabolic Panel: Recent Labs  Lab 02/27/21 1626 02/28/21 0549  NA 138 137  K 4.0 4.4  CL 105 104  CO2 24 24  GLUCOSE 80 84  BUN 8 8  CREATININE 1.08 1.16  CALCIUM 9.3 9.1   GFR: Estimated Creatinine Clearance: 90.9 mL/min (by C-G formula based on SCr of 1.16 mg/dL). Liver Function Tests: Recent Labs  Lab 02/27/21 1626  AST 41  ALT 34  ALKPHOS 43  BILITOT 0.6  PROT 6.2*  ALBUMIN 3.6   No results for input(s): LIPASE, AMYLASE in the last 168 hours. No results for input(s): AMMONIA in the last 168 hours. Coagulation Profile: No results for input(s): INR, PROTIME in the last 168 hours. Cardiac Enzymes: No results for input(s): CKTOTAL,  CKMB, CKMBINDEX, TROPONINI in the last 168 hours. BNP (last 3 results) No results for input(s): PROBNP in the last 8760 hours. HbA1C: No results for input(s): HGBA1C in the last 72 hours. CBG: No results for input(s): GLUCAP in the last 168 hours. Lipid Profile: Recent Labs    02/27/21 1626  CHOL 145  HDL 80  LDLCALC 59  TRIG 29  CHOLHDL 1.8   Thyroid Function Tests: Recent Labs  02/27/21 1626  TSH 0.599   Anemia Panel: No results for input(s): VITAMINB12, FOLATE, FERRITIN, TIBC, IRON, RETICCTPCT in the last 72 hours. Sepsis Labs: No results for input(s): PROCALCITON, LATICACIDVEN in the last 168 hours.  Recent Results (from the past 240 hour(s))  Resp Panel by RT-PCR (Flu A&B, Covid) Nasopharyngeal Swab     Status: None   Collection Time: 02/27/21  4:16 PM   Specimen: Nasopharyngeal Swab; Nasopharyngeal(NP) swabs in vial transport medium  Result Value Ref Range Status   SARS Coronavirus 2 by RT PCR NEGATIVE NEGATIVE Final    Comment: (NOTE) SARS-CoV-2 target nucleic acids are NOT DETECTED.  The SARS-CoV-2 RNA is generally detectable in upper respiratory specimens during the acute phase of infection. The lowest concentration of SARS-CoV-2 viral copies this assay can detect is 138 copies/mL. A negative result does not preclude SARS-Cov-2 infection and should not be used as the sole basis for treatment or other patient management decisions. A negative result may occur with  improper specimen collection/handling, submission of specimen other than nasopharyngeal swab, presence of viral mutation(s) within the areas targeted by this assay, and inadequate number of viral copies(<138 copies/mL). A negative result must be combined with clinical observations, patient history, and epidemiological information. The expected result is Negative.  Fact Sheet for Patients:  BloggerCourse.com  Fact Sheet for Healthcare Providers:   SeriousBroker.it  This test is no t yet approved or cleared by the [place] FDA and  has been authorized for detection and/or diagnosis of SARS-CoV-2 by FDA under an Emergency Use Authorization (EUA). This EUA will remain  in effect (meaning this test can be used) for the duration of the COVID-19 declaration under Section 564(b)(1) of the Act, 21 U.S.C.section 360bbb-3(b)(1), unless the authorization is terminated  or revoked sooner.       Influenza A by PCR NEGATIVE NEGATIVE Final   Influenza B by PCR NEGATIVE NEGATIVE Final    Comment: (NOTE) The Xpert Xpress SARS-CoV-2/FLU/RSV plus assay is intended as an aid in the diagnosis of influenza from Nasopharyngeal swab specimens and should not be used as a sole basis for treatment. Nasal washings and aspirates are unacceptable for Xpert Xpress SARS-CoV-2/FLU/RSV testing.  Fact Sheet for Patients: BloggerCourse.com  Fact Sheet for Healthcare Providers: SeriousBroker.it  This test is not yet approved or cleared by the [place] FDA and has been authorized for detection and/or diagnosis of SARS-CoV-2 by FDA under an Emergency Use Authorization (EUA). This EUA will remain in effect (meaning this test can be used) for the duration of the COVID-19 declaration under Section 564(b)(1) of the Act, 21 U.S.C. section 360bbb-3(b)(1), unless the authorization is terminated or revoked.  Performed at [facility], [address] [place] [address], [place], [place] [address]          Radiology Studies: CT CORONARY MORPH W/CTA COR W/SCORE W/CA W/CM &/OR WO/CM  Result Date: 02/28/2021 EXAM: OVER-READ INTERPRETATION  CT CHEST The following report is an over-read performed by radiologist Dr. Trudie Reed of [facility] on 02/28/2021. This over-read does not include interpretation of cardiac or coronary anatomy or pathology. The coronary calcium  score/coronary CTA interpretation by the cardiologist is attached. COMPARISON:  None. FINDINGS: Within the visualized portions of the thorax there are no suspicious appearing pulmonary nodules or masses, there is no acute consolidative airspace disease, no pleural effusions, no pneumothorax and no lymphadenopathy. Visualized portions of the upper abdomen are unremarkable. There are no aggressive appearing lytic or blastic lesions noted in the visualized  portions of the skeleton. IMPRESSION: 1. No significant incidental noncardiac findings are noted. Electronically Signed   By: Trudie Reed M.D.   On: 02/28/2021 12:42        Scheduled Meds:  aspirin EC  81 mg Oral Daily   atorvastatin  20 mg Oral Daily   escitalopram  10 mg Oral Daily   QUEtiapine  50 mg Oral QHS   Continuous Infusions:  heparin 1,050 Units/hr (02/28/21 0557)   lactated ringers 75 mL/hr at 02/28/21 0558     LOS: 0 days    Time spent:20 min    Tarius Stangelo, Roselind Messier, MD [facility]   If 7PM-7AM, please contact night-coverage 02/28/2021, 2:01 PM

## 2021-02-28 NOTE — Consult Note (Addendum)
Cardiology Consultation:   Patient ID: Kyle Sweeney MRN: [id number]; DOB: [birth year]  Admit date: 02/27/2021 Date of Consult: 02/28/2021  PCP:  Patient, No Pcp Per (Inactive)   [facility] Providers Cardiologist:  Christell Constant, MD   New   Patient Profile:   Kyle Sweeney is a 35 y.o. male with a hx of past medical history of polysubstance abuse, bipolar 1 disorder, suicidal ideation who is being seen 02/28/2021 for the evaluation of elevated troponin/chest pain at the request of Dr. Joseph Art.  History of Present Illness:   Kyle Sweeney is a 35 year old male with past medical history noted above.  He has never been evaluated by cardiology in the past.  He denies any family history of CAD.  He initially presented to behavioral health with complaints of suicidal ideation.  EKG that was done and showed sinus rhythm, 74 bpm, T wave inversion in lead I through lead III, V6, LVH.  He was brought to [facility] ED for further evaluation given abnormal troponin.   In the ED his labs showed sodium 138, potassium 4, creatinine 1.08, high-sensitivity troponin 61>> 65>> 56, WBC 10.6, hemoglobin 14.4 TSH 0.59.  UDS positive for cocaine and marijuana.  EKG was reviewed by Dr. Okey Dupre and felt not to be a STEMI.  Therefore canceled.  Internal medicine called for admission.  In speaking with the patient he denies any chest pain prior to admission.  States he has not had any shortness of breath or anginal symptoms prior to presenting to behavioral health.  He does smoke cigarettes as well as admits to using marijuana Saturday prior to admission.  Denies any known use of cocaine but states that the marijuana he smoked may have been laced with cocaine given his UDS was positive.    Past Medical History:  Diagnosis Date   Anxiety    Bipolar 1 disorder (HCC)    Cocaine abuse (HCC)    Herpes    Polysubstance abuse (HCC)     History reviewed. No pertinent surgical history.   Home Medications:  Prior to  Admission medications   Medication Sig Start Date End Date Taking? Authorizing Provider  acetaminophen (TYLENOL) 500 MG tablet Take 1,000 mg by mouth every 6 (six) hours as needed for moderate pain or headache.   Yes [provider]  multivitamin (ONE-A-DAY MEN'S) TABS tablet Take 1 tablet by mouth daily.   Yes [provider]  folic acid (FOLVITE) 1 MG tablet Take 1 tablet (1 mg total) by mouth daily. Patient not taking: Reported on 02/28/2021 09/05/17   Arlyce Harman, MD  PARoxetine (PAXIL) 10 MG tablet TAKE 1 TABLET (10 MG TOTAL) BY MOUTH AT BEDTIME. Patient not taking: Reported on 02/28/2021 07/27/20 07/27/21  Zena Amos, MD  QUEtiapine (SEROQUEL) 100 MG tablet TAKE 1 TABLET (100 MG TOTAL) BY MOUTH AT BEDTIME. Patient not taking: Reported on 02/28/2021 07/27/20 07/27/21  Zena Amos, MD  thiamine 100 MG tablet Take 1 tablet (100 mg total) by mouth daily. Patient not taking: Reported on 02/28/2021 09/05/17   Arlyce Harman, MD    Inpatient Medications: Scheduled Meds:  aspirin EC  81 mg Oral Daily   atorvastatin  20 mg Oral Daily   escitalopram  10 mg Oral Daily   QUEtiapine  50 mg Oral QHS   Continuous Infusions:  heparin 1,050 Units/hr (02/28/21 0557)   lactated ringers 75 mL/hr at 02/28/21 0558   PRN Meds: acetaminophen, ondansetron (ZOFRAN) IV  Allergies:   No Known Allergies  Social History:  Social History   Socioeconomic History   Marital status: Single    Spouse name: Not on file   Number of children: Not on file   Years of education: Not on file   Highest education level: Not on file  Occupational History   Not on file  Tobacco Use   Smoking status: Every Day    Types: Cigarettes   Smokeless tobacco: Never  Vaping Use   Vaping Use: Never used  Substance and Sexual Activity   Alcohol use: Yes    Comment: occasional use   Drug use: Yes    Types: Cocaine, Marijuana    Comment: pt states he does not currently use cocaine   Sexual  activity: Not Currently    Birth control/protection: None  Other Topics Concern   Not on file  Social History Narrative   Not on file   Social Determinants of Health   Financial Resource Strain: Not on file  Food Insecurity: Not on file  Transportation Needs: Not on file  Physical Activity: Not on file  Stress: Not on file  Social Connections: Not on file  Intimate Partner Violence: Not on file    Family History:   History reviewed. No pertinent family history.   ROS:  Please see the history of present illness.   All other ROS reviewed and negative.     Physical Exam/Data:   Vitals:   02/28/21 0500 02/28/21 0900 02/28/21 1000 02/28/21 1030  BP: 133/77 109/65 129/85 120/86  Pulse: 66 70 64 70  Resp: (!) 22 20 16 17   Temp:      TempSrc:      SpO2: 98% 98% 99% 98%  Weight:      Height:       No intake or output data in the 24 hours ending 02/28/21 1046 Last 3 Weights 02/28/2021 10/12/2020 09/03/2017  Weight (lbs) 164 lb 14.5 oz 165 lb 165 lb 11.2 oz  Weight (kg) 74.8 kg 74.844 kg 75.161 kg  Some encounter information is confidential and restricted. Go to Review Flowsheets activity to see all data.     Body mass index is 23.66 kg/m.  General:  Well nourished, well developed, in no acute distress HEENT: normal Neck: no JVD Vascular: No carotid bruits; Distal pulses 2+ bilaterally Cardiac:  normal S1, S2; RRR; no murmur  Lungs:  clear to auscultation bilaterally, no wheezing, rhonchi or rales  Abd: soft, nontender, no hepatomegaly  Ext: no edema Musculoskeletal:  No deformities, BUE and BLE strength normal and equal Skin: warm and dry  Neuro:  CNs 2-12 intact, no focal abnormalities noted Psych:  Normal affect   EKG:  The EKG was personally reviewed and demonstrates: Sinus rhythm, 74 bpm, T wave inversion in lead I through III, V6, LVH Telemetry:  Telemetry was personally reviewed and demonstrates: Sinus rhythm  Relevant CV Studies:  N/a   Laboratory  Data:  High Sensitivity Troponin:   Recent Labs  Lab 02/28/21 0149 02/28/21 0358 02/28/21 0549 02/28/21 0915  TROPONINIHS 61* 65* 56* 44*     Chemistry Recent Labs  Lab 02/27/21 1626 02/28/21 0549  NA 138 137  K 4.0 4.4  CL 105 104  CO2 24 24  GLUCOSE 80 84  BUN 8 8  CREATININE 1.08 1.16  CALCIUM 9.3 9.1  GFRNONAA >60 >60  ANIONGAP 9 9    Recent Labs  Lab 02/27/21 1626  PROT 6.2*  ALBUMIN 3.6  AST 41  ALT 34  ALKPHOS 43  BILITOT  0.6   Lipids  Recent Labs  Lab 02/27/21 1626  CHOL 145  TRIG 29  HDL 80  LDLCALC 59  CHOLHDL 1.8    Hematology Recent Labs  Lab 02/27/21 1626 02/28/21 0549  WBC 11.1* 10.6*  RBC 5.01 4.89  HGB 14.8 14.4  HCT 44.3 45.3  MCV 88.4 92.6  MCH 29.5 29.4  MCHC 33.4 31.8  RDW 12.6 12.8  PLT 235 218   Thyroid  Recent Labs  Lab 02/27/21 1626  TSH 0.599    BNPNo results for input(s): BNP, PROBNP in the last 168 hours.  DDimer No results for input(s): DDIMER in the last 168 hours.   Radiology/Studies:  No results found.  Assessment and Plan:   Kyle Sweeney is a 35 y.o. male with a hx of past medical history of polysubstance abuse, bipolar 1 disorder, suicidal ideation who is being seen 02/28/2021 for the evaluation of elevated troponin/chest pain at the request of Dr. Joseph Art.  Chest pain/elevated troponin: Sensitivity troponin 61>> 65>> 56.  EKG noted to be abnormal and code STEMI called initially at behavioral health with patient transferred to [facility].  This was canceled by Dr. Okey Dupre. Patient adamantly denies any chest pain, shortness of breath or anginal symptoms prior to admission.  No family history.  No concerning risk factors other than tobacco use.  EKG showed new T wave inversion in lead I through lead III, as well as V6.  Repeat EKG today shows biphasic T wave in lead I, lead III V2 and V6.  He remains pain-free.  --Check echocardiogram --Will review further MD regarding further cardiac imaging, consider coronary CT  given abnormal EKG findings -- started on ASA, statin on admission -- check lipids, Hgb A1c  Polysubstance abuse: UDS on admission positive for cocaine and marijuana  Suicidal ideation/Bipolar 1 disorder: Initially presented to behavioral health with SI. --Further management per primary  Risk Assessment/Risk Scores:   HEAR Score (for undifferentiated chest pain):  HEAR Score: 1  For questions or updates, please contact [facility] Please consult [email or web address] for contact info under    Signed, Kyle Page, NP  02/28/2021 10:46 AM  Personally seen and examined. Agree with APP above with the following comments: Briefly 35 yo M  with a history of Bipolar 1 and SI (presently resolved) who presents after ECG changes were noted (patient denies CP).  Cath lab activation deferred and his labs are not suggestive of STEMI. Patient notes that he is worried he has caused damage to his heart because of what he was laced with.  Denies CP, SOB, palpitations, syncope, or SI. Exam is largely benign. Labs notable for peak troponin 65 Personally reviewed relevant tests; ECG is notable for LVH pattern with secondary repolarization Would recommend  - patient has noted to other providers through evaluation that he has had anginal pain; he has a known risk factor for CAD in his substance intake, and his ecg almost lead to a cath lab activation - will rule out ischemic disease with CT (using diltiazem instead of metoprolol)  - will get echo, I suspect some LVH - if these results do not show significant disease, can have PRN f/u - if mild non obstructive disease, continue statin and can see be in follow up  Riley Lam, MD Cardiologist [facility]  [address] [place], [place] [address] [phone]  12:24 PM

## 2021-02-28 NOTE — ED Notes (Signed)
Pt ambulatory to restroom

## 2021-02-28 NOTE — ED Notes (Signed)
Patient transported to CT 

## 2021-02-28 NOTE — Progress Notes (Signed)
  Echocardiogram 2D Echocardiogram has been performed.  Roosvelt Maser F 02/28/2021, 5:48 PM

## 2021-02-28 NOTE — ED Notes (Signed)
Paged Cardiology per PA, [place].

## 2021-02-28 NOTE — ED Notes (Signed)
Pt valuables and belongings collected from locker 4 and security to go with patient upstairs

## 2021-02-28 NOTE — Progress Notes (Signed)
Since arrival on unit pt is refusing care from staff. Pt stated did not want sitter present in room. Explained to pt the policies of the facility. Pt stated, "did not want to have anyone present watching him." Charge nurse and AC at bedside to talk with pt.

## 2021-02-28 NOTE — H&P (Signed)
History and Physical    Kyle Sweeney IRC:[id number] DOB: [birth year] DOA: 02/27/2021  PCP: Patient, No Pcp Per (Inactive)   Patient coming from: Behavioral Health  Chief Complaint: Depression, suicidal thoughts, tachycardia  HPI: Kyle Sweeney is a 35 y.o. male with medical history significant for bipolar disorder, polysubstance abuse who went to behavioral health yesterday for suicidal ideation and was found to have tachycardia and an abnormal EKG so he was sent to the emergency room for further evaluation.  In the emergency room he had elevated troponin levels of 60.  He denies any chest pain, shortness of breath nausea vomiting or diarrhea.  He states he feels well and has no physical complaints.  He reports he does not have any suicidal ideation at this time but does admit that the thoughts come and go when they come they can become very overwhelming.  He has been on Paxil and Seroquel in the past but has not been on medication for the last 6 months.  When he had significant thoughts of committing suicide yesterday he went to [facility] health for assistance resumed on medications. To smoking 2 packs of cigarettes this week and occasionally smokes marijuana.  He does admit that he smokes marijuana 2 days ago.  He states he did not use any cocaine but he has in the past.  He is not sure if the marijuana that he was given was laced with cocaine.  ED Course: Mr. Kyle Sweeney has been hemodynamically stable in the emergency room.  Found to have an abnormal EKG and had elevated troponin levels of 61 and 65.  CBC CMP were unremarkable.  Lipid panel showed cholesterol of 145 HDL of 80 LDL 59 triglycerides of 29.  TSH 0.599.  COVID test negative.  HIV test negative.  Alcohol level negative.  UDS positive for cocaine and marijuana  Review of Systems:  General: Denies fever, chills, weight loss, night sweats.  Denies dizziness.  Denies change in appetite HENT: Denies head trauma, headache, denies change in  hearing, tinnitus.  Denies nasal congestion or bleeding.  Denies sore throat.  Denies difficulty swallowing Eyes: Denies blurry vision, pain in eye, drainage.  Denies discoloration of eyes. Neck: Denies pain.  Denies swelling.  Denies pain with movement. Cardiovascular: Denies chest pain, palpitations.  Denies edema.  Denies orthopnea Respiratory: Denies shortness of breath, cough.  Denies wheezing.  Denies sputum production Gastrointestinal: Denies abdominal pain, swelling.  Denies nausea, vomiting, diarrhea.  Denies melena.  Denies hematemesis. Musculoskeletal: Denies limitation of movement.  Denies deformity or swelling.  Denies pain.  Denies arthralgias or myalgias. Genitourinary: Denies pelvic pain.  Denies urinary frequency or hesitancy.  Denies dysuria.  Skin: Denies rash.  Denies petechiae, purpura, ecchymosis. Neurological: Denies syncope.  Denies seizure activity.  Denies paresthesia.  Denies slurred speech, drooping face.  Denies visual change. Psychiatric: Reports depression, anxiety. Reports suicidal thoughts or ideation.  Denies hallucinations.  Past Medical History:  Diagnosis Date   Anxiety    Bipolar 1 disorder (HCC)    Cocaine abuse (HCC)    Herpes    Polysubstance abuse (HCC)     History reviewed. No pertinent surgical history.  Social History  reports that he has been smoking cigarettes. He has never used smokeless tobacco. He reports current alcohol use. He reports current drug use. Drugs: Cocaine and Marijuana.  No Known Allergies  History reviewed. No pertinent family history.   Prior to Admission medications   Medication Sig Start Date End Date Taking? Authorizing Provider  acetaminophen (TYLENOL) 500 MG tablet Take 1,000 mg by mouth every 6 (six) hours as needed for moderate pain or headache.   Yes [provider]  multivitamin (ONE-A-DAY MEN'S) TABS tablet Take 1 tablet by mouth daily.   Yes [provider]  folic acid (FOLVITE) 1 MG  tablet Take 1 tablet (1 mg total) by mouth daily. Patient not taking: Reported on 02/28/2021 09/05/17   Arlyce Harman, MD  PARoxetine (PAXIL) 10 MG tablet TAKE 1 TABLET (10 MG TOTAL) BY MOUTH AT BEDTIME. Patient not taking: Reported on 02/28/2021 07/27/20 07/27/21  Zena Amos, MD  QUEtiapine (SEROQUEL) 100 MG tablet TAKE 1 TABLET (100 MG TOTAL) BY MOUTH AT BEDTIME. Patient not taking: Reported on 02/28/2021 07/27/20 07/27/21  Zena Amos, MD  thiamine 100 MG tablet Take 1 tablet (100 mg total) by mouth daily. Patient not taking: Reported on 02/28/2021 09/05/17   Arlyce Harman, MD    Physical Exam: Vitals:   02/27/21 2048 02/28/21 0201 02/28/21 0500  BP: 140/84  133/77  Pulse: 89  66  Resp: 15  (!) 22  Temp: 98.4 F (36.9 C)    TempSrc: Oral    SpO2: 99%  98%  Weight:  74.8 kg   Height:  5\' 10"  (1.778 m)     Constitutional: NAD, calm, comfortable Vitals:   02/27/21 2048 02/28/21 0201 02/28/21 0500  BP: 140/84  133/77  Pulse: 89  66  Resp: 15  (!) 22  Temp: 98.4 F (36.9 C)    TempSrc: Oral    SpO2: 99%  98%  Weight:  74.8 kg   Height:  5\' 10"  (1.778 m)    General: WDWN, Alert and oriented x3.  Eyes: EOMI, PERRL, conjunctivae normal.  Sclera nonicteric HENT:  [place]/AT, external ears normal.  Nares patent without epistasis.  Mucous membranes are moist. Posterior pharynx clear of any exudate or lesions. Normal dentition.  Neck: Soft, normal range of motion, supple, no masses, no thyromegaly.  Trachea midline Respiratory: clear to auscultation bilaterally, no wheezing, no crackles. Normal respiratory effort. No accessory muscle use.  Cardiovascular: Regular rate and rhythm, no murmurs / rubs / gallops. No extremity edema. 2+ pedal pulses. Abdomen: Soft, no tenderness, nondistended, no rebound or guarding.  No masses palpated.  Bowel sounds normoactive Musculoskeletal: FROM. no cyanosis. No joint deformity upper and lower extremities. no contractures. Normal muscle tone.  Skin:  Warm, dry, intact no rashes, lesions, ulcers. No induration Neurologic: CN 2-12 grossly intact.  Normal speech.  Sensation intact, Strength 5/5 in all extremities.   Psychiatric: Depressed mood and flat affect.  Denies hallucinations   Labs on Admission: I have personally reviewed following labs and imaging studies  CBC: Recent Labs  Lab 02/27/21 1626  WBC 11.1*  NEUTROABS 8.1*  HGB 14.8  HCT 44.3  MCV 88.4  PLT 235    Basic Metabolic Panel: Recent Labs  Lab 02/27/21 1626  NA 138  K 4.0  CL 105  CO2 24  GLUCOSE 80  BUN 8  CREATININE 1.08  CALCIUM 9.3    GFR: Estimated Creatinine Clearance: 97.6 mL/min (by C-G formula based on SCr of 1.08 mg/dL).  Liver Function Tests: Recent Labs  Lab 02/27/21 1626  AST 41  ALT 34  ALKPHOS 43  BILITOT 0.6  PROT 6.2*  ALBUMIN 3.6    Urine analysis:    Component Value Date/Time   COLORURINE YELLOW 11/03/2009 1549   APPEARANCEUR CLEAR 11/03/2009 1549   LABSPEC 1.034 (H) 11/03/2009 1549  PHURINE 6.0 11/03/2009 1549   GLUCOSEU NEGATIVE 11/03/2009 1549   HGBUR NEGATIVE 11/03/2009 1549   BILIRUBINUR NEGATIVE 11/03/2009 1549   KETONESUR NEGATIVE 11/03/2009 1549   PROTEINUR NEGATIVE 11/03/2009 1549   UROBILINOGEN 0.2 11/03/2009 1549   NITRITE NEGATIVE 11/03/2009 1549   LEUKOCYTESUR  11/03/2009 1549    NEGATIVE MICROSCOPIC NOT DONE ON URINES WITH NEGATIVE PROTEIN, BLOOD, LEUKOCYTES, NITRITE, OR GLUCOSE <1000 mg/dL.    Radiological Exams on Admission: No results found.  EKG: Independently reviewed.  EKG shows normal sinus rhythm with LVH by voltage criteria.  Elevated ST segments.  Assessment/Plan Principal Problem:   NSTEMI (non-ST elevated myocardial infarction)  Mr. Fleming admitted on cardiac telemetry.  Obtain serial troponin levels.  Opponent level is elevated and will consult cardiology for further evaluation.  Patient may require further testing or catheterization.  Antiplatelet therapy with aspirin daily.  Check  lipid panel.  Start statin therapy.  Monitor blood pressure.  Nitroglycerin as needed.  Supplemental oxygen as needed to maintain O2 sat between 92-96% Beta-blockers not given with cocaine on UDS  Active Problems:   Polysubstance abuse  Patient with history of marijuana use.  He has history of cocaine use in the past but states has not used recently.  He is not sure if some of the marijuana he smoked recently was laced with cocaine.    Bipolar 1 disorder Has been off his medications for the last 6 months.  Resume SSRI therapy with Lexapro.  Resume Seroquel at 50 mg a day.  Patient will need psychiatry consult in the morning once medically stable    Suicidal ideations Patient reports having suicidal thoughts and did have a plan to cut himself yesterday morning.  He states he is not thinking hurting himself at this time but does admit that the thoughts will come and go.  He went to behavioral health for evaluation and help yesterday and was found to have tachycardia and abnormal EKG so sent to the emergency room.  Patient placed on suicide precautions and safety sitter ordered    DVT prophylaxis: On Heparin infusion for ACS  Code Status:   Full Code  Family Communication:  Diagnosis plan discussed with patient.  Patient verbalized understanding agrees with plan.  Further recommendation follow as clinical indicated Disposition Plan:   Patient is from:  Behavioral health  Anticipated DC to:  Behavioral health  Anticipated DC date:  Anticipate 2 midnight stay  Consults called:  Cardiology  Admission status:  Inpatient.  Claudean Severance Keevan Wolz MD [facility]  How to contact the [facility] Attending or Consulting provider 7A - 7P or covering provider during after hours 7P -7A, for this patient?   Check the care team in [facility] and look for a) attending/consulting [facility] provider listed and b) the [facility] team listed Log into [email or web address] and use [facility] universal password to access. If you do not  have the password, please contact the hospital operator. Locate the [facility] provider you are looking for under [facility] and page to a number that you can be directly reached. If you still have difficulty reaching the provider, please page the [facility] (Director on Call) for the Hospitalists listed on amion for assistance.  02/28/2021, 5:40 AM

## 2021-02-28 NOTE — ED Notes (Signed)
Patient was changed into burgundy scrubs. Patient was wanded by security. All items were collected. Phone, keys, and wallet were given to security, all other belongings were inventoried and locked in a locker.Cords are remaining in the room for cardiac monitoring per ED provider in triage. Curtain will remain open for safety.

## 2021-02-28 NOTE — ED Provider Notes (Signed)
[facility] EMERGENCY DEPARTMENT Provider Note   CSN: [id number] Arrival date & time: 02/27/21  2007     History No chief complaint on file.   Kyle Sweeney is a 35 y.o. male.  Patient transferred from [facility] where he was being evaluated for SI, for evaluation of abnormal EKG that was read as acute STEMI. The patient denies chest pain, SOB, nausea. He states he feels physically well.   The history is provided by the patient. No language interpreter was used.      Past Medical History:  Diagnosis Date   Anxiety    Bipolar 1 disorder (HCC)    Cocaine abuse (HCC)    Herpes    Polysubstance abuse (HCC)     Patient Active Problem List   Diagnosis Date Noted   Suicidal ideations 02/27/2021   Cocaine abuse (HCC) 10/12/2020   Herpes 10/12/2020   Bipolar 1 disorder (HCC) 10/12/2020   Anxiety 04/26/2020   Bipolar I disorder, most recent episode depressed (HCC)    Aspiration pneumonitis (HCC) 09/02/2017   Aspiration pneumonia (HCC) 09/02/2017   Aspiration pneumonia of right middle lobe due to vomit (HCC)    Hypoxia    Polysubstance abuse (HCC)     No past surgical history on file.     No family history on file.  Social History   Tobacco Use   Smoking status: Every Day    Types: Cigarettes   Smokeless tobacco: Never  Vaping Use   Vaping Use: Never used  Substance Use Topics   Alcohol use: Yes    Comment: occasional use   Drug use: Yes    Types: Cocaine, Marijuana    Comment: pt states he does not currently use cocaine    Home Medications Prior to Admission medications   Medication Sig Start Date End Date Taking? Authorizing Provider  folic acid (FOLVITE) 1 MG tablet Take 1 tablet (1 mg total) by mouth daily. 09/05/17   Arlyce Harman, MD  PARoxetine (PAXIL) 10 MG tablet TAKE 1 TABLET (10 MG TOTAL) BY MOUTH AT BEDTIME. 07/27/20 07/27/21  Zena Amos, MD  QUEtiapine (SEROQUEL) 100 MG tablet TAKE 1 TABLET (100 MG TOTAL) BY MOUTH AT BEDTIME.  07/27/20 07/27/21  Zena Amos, MD  thiamine 100 MG tablet Take 1 tablet (100 mg total) by mouth daily. 09/05/17   Arlyce Harman, MD    Allergies    Patient has no known allergies.  Review of Systems   Review of Systems  Constitutional:  Negative for chills and fever.  HENT: Negative.    Respiratory: Negative.    Cardiovascular: Negative.   Gastrointestinal: Negative.   Musculoskeletal: Negative.   Skin: Negative.   Neurological: Negative.    Physical Exam Updated Vital Signs BP 140/84 (BP Location: Right Arm)   Pulse 89   Temp 98.4 F (36.9 C) (Oral)   Resp 15   SpO2 99%   Physical Exam Vitals and nursing note reviewed.  Constitutional:      Appearance: He is well-developed.  HENT:     Head: Normocephalic.  Cardiovascular:     Rate and Rhythm: Normal rate and regular rhythm.     Heart sounds: No murmur heard. Pulmonary:     Effort: Pulmonary effort is normal.     Breath sounds: Normal breath sounds. No wheezing, rhonchi or rales.  Abdominal:     General: Bowel sounds are normal.     Palpations: Abdomen is soft.     Tenderness: There is no abdominal  tenderness. There is no guarding or rebound.  Musculoskeletal:        General: Normal range of motion.     Cervical back: Normal range of motion and neck supple.  Skin:    General: Skin is warm and dry.  Neurological:     General: No focal deficit present.     Mental Status: He is alert and oriented to person, place, and time.    ED Results / Procedures / Treatments   Labs (all labs ordered are listed, but only abnormal results are displayed) Labs Reviewed - No data to display  EKG None  Radiology No results found.  Procedures Procedures   Medications Ordered in ED Medications - No data to display  ED Course  I have reviewed the triage vital signs and the nursing notes.  Pertinent labs & imaging results that were available during my care of the patient were reviewed by me and considered in my  medical decision making (see chart for details).    MDM Rules/Calculators/A&P                           Patient to ED for evaluation of abnormal EKG done at [facility] while being evaluated for SI. No coronary disease history, no chest pain, no SOB, no nausea and no diaphoresis.   Very well appearing patient in NAD. VSS. Labs done at [facility] prior to arrival reviewed.   EKG reviewed by Dr. Madilyn Hook and confirmed by cardiology (Dr. Okey Dupre). No STEMI. Troponin collected.   Initial troponin 61. Still not having pain. No new symptoms. Given abnormal EKG, elevated troponin, will consult cards for admission. Delta trop pending. Patient updated on plan for admission.   Discussed with cardiology who reviewed the EKG. Feels no STEMI, likely related to cocaine use with elevated trop. Admit to medicine, cards will consult/follow. Discussed with Dr. Lewis Moccasin who will admit.     Final Clinical Impression(s) / ED Diagnoses Final diagnoses:  None   Abnormal EKG Elevated troponin Cocaine use SI   Rx / DC Orders ED Discharge Orders     None        Danne Harbor 02/28/21 0515    Tilden Fossa, MD 02/28/21 [phone]

## 2021-02-28 NOTE — Progress Notes (Signed)
  X-cover Note: Asked by bedside RN and charge RN to come speak to patient. Pt becoming increasingly agitated on the floor. Refusing care. Refusing telemetry. Will not let safety sitter in the room with him.  I approached the patient cautiously. Introduced my self. Pt states he is angry that no one has asked him how he is doing mentally since getting admitted in the ER. I apologized for that and asked him how he was doing.  He stated not great.  Has not been taking mental health meds for several months. Appears that rounding attending was going to get psycho involved tomorrow.  Pt agreeable to taking his seroquel tonight. Have ordered prn trazodone for him. Pt state he hasn't slept well in many years.  Pt agreeable to letting safety sitter remain outside his room.  Asked Rn to call cardiology and determine if the rest of his cardiac meds/telemetry/heparin gtts was necessary.   Carollee Herter, DO [facility]

## 2021-02-28 NOTE — ED Notes (Addendum)
Patient states that he has had thoughts of killing himself, with a plan to cut himself. Patient states "Its a day to day thing I've been struggling with, these thoughts, PTSD, depression, I am just going through a lot" Patient denies any hx of actual attempts stating he "never has tried physicially but has thought about it"   Patient changed into burgendy scrubs. Monitoring cords left attached to patient due to irregular EKG at [facility]. Curtain will remain open to ensure patient safety.

## 2021-02-28 NOTE — ED Notes (Signed)
Belongings in locker 4  

## 2021-02-28 NOTE — Progress Notes (Addendum)
Pt refusing IV heparin, telemetry and sitter for suicidal ideations per primary nurse, Dell Ponto.  Went to bedside to speak with patient and he confirmed he did come from [facility] for heart test.  He verbalized he has had suicidal ideations within 24 hours and states his anxiety is not going to allow for a sitter to be watching him.  He states, "what are you going to do if I rip all of this out of my arm and leave"  I can leave, I am not a prisoner.  He states he does not need anyone to watch over him.  Pt using foul and threatening language as well ad body language becoming very aggressive toward staff with [facility] in room.  Patient not allowing any staff to approach him or "it's about to get fun".  Called Dr. Imogene Burn to notify of patient refusal and patient becoming very verbally aggressive. MD to round at bedside to speak with patient.

## 2021-02-28 NOTE — ED Notes (Signed)
6E called to initiate purpleman. 

## 2021-02-28 NOTE — Progress Notes (Signed)
ANTICOAGULATION CONSULT NOTE - Initial Consult  Pharmacy Consult for Heparin Indication: chest pain/ACS  No Known Allergies  Patient Measurements: Height: 5\' 10"  (177.8 cm) Weight: 74.8 kg (164 lb 14.5 oz) IBW/kg (Calculated) : 73 Heparin Dosing Weight: 75 kg  Vital Signs: Temp: 98.4 F (36.9 C) (11/27 2048) Temp Source: Oral (11/27 2048) BP: 133/77 (11/28 0500) Pulse Rate: 66 (11/28 0500)  Labs: Recent Labs    02/27/21 1626 02/28/21 0149  HGB 14.8  --   HCT 44.3  --   PLT 235  --   CREATININE 1.08  --   TROPONINIHS  --  61*    Estimated Creatinine Clearance: 97.6 mL/min (by C-G formula based on SCr of 1.08 mg/dL).   Medical History: Past Medical History:  Diagnosis Date   Anxiety    Bipolar 1 disorder (HCC)    Cocaine abuse (HCC)    Herpes    Polysubstance abuse (HCC)     Medications:  See electronic med rec  Assessment: 35 y.o. M transferred from [facility] for abnormal EKG. To begin heparin for ACS. No AC PTA. CBC ok on admission.  Goal of Therapy:  Heparin level 0.3-0.7 units/ml Monitor platelets by anticoagulation protocol: Yes   Plan:  Heparin IV bolus 4000 units Heparin gtt at 1050 units/hr Will f/u heparin level in 6 hours Daily heparin level and CBC  [facility], PharmD, BCPS Please see amion for complete clinical pharmacist phone list 02/28/2021,5:18 AM

## 2021-02-28 NOTE — ED Notes (Signed)
Pt denies any feelings of SI or HI at this time

## 2021-02-28 NOTE — ED Triage Notes (Signed)
The pt has been caalled numerus times but did not answer  he admits that he left and came back without saying anything to staff  he walked back to behuc to get his car and have it  near

## 2021-03-01 ENCOUNTER — Other Ambulatory Visit (HOSPITAL_COMMUNITY): Payer: Self-pay

## 2021-03-01 ENCOUNTER — Telehealth (HOSPITAL_COMMUNITY): Payer: Self-pay | Admitting: Emergency Medicine

## 2021-03-01 DIAGNOSIS — R778 Other specified abnormalities of plasma proteins: Secondary | ICD-10-CM

## 2021-03-01 DIAGNOSIS — F319 Bipolar disorder, unspecified: Secondary | ICD-10-CM

## 2021-03-01 DIAGNOSIS — F1910 Other psychoactive substance abuse, uncomplicated: Secondary | ICD-10-CM

## 2021-03-01 DIAGNOSIS — F1490 Cocaine use, unspecified, uncomplicated: Secondary | ICD-10-CM

## 2021-03-01 DIAGNOSIS — R45851 Suicidal ideations: Secondary | ICD-10-CM

## 2021-03-01 DIAGNOSIS — R9431 Abnormal electrocardiogram [ECG] [EKG]: Secondary | ICD-10-CM

## 2021-03-01 LAB — CBC WITH DIFFERENTIAL/PLATELET
Abs Immature Granulocytes: 0.03 10*3/uL (ref 0.00–0.07)
Basophils Absolute: 0 10*3/uL (ref 0.0–0.1)
Basophils Relative: 0 %
Eosinophils Absolute: 0 10*3/uL (ref 0.0–0.5)
Eosinophils Relative: 0 %
HCT: 40.8 % (ref 39.0–52.0)
Hemoglobin: 13.1 g/dL (ref 13.0–17.0)
Immature Granulocytes: 0 %
Lymphocytes Relative: 30 %
Lymphs Abs: 2.9 10*3/uL (ref 0.7–4.0)
MCH: 29.2 pg (ref 26.0–34.0)
MCHC: 32.1 g/dL (ref 30.0–36.0)
MCV: 90.9 fL (ref 80.0–100.0)
Monocytes Absolute: 0.5 10*3/uL (ref 0.1–1.0)
Monocytes Relative: 6 %
Neutro Abs: 5.9 10*3/uL (ref 1.7–7.7)
Neutrophils Relative %: 64 %
Platelets: 231 10*3/uL (ref 150–400)
RBC: 4.49 MIL/uL (ref 4.22–5.81)
RDW: 12.6 % (ref 11.5–15.5)
WBC: 9.4 10*3/uL (ref 4.0–10.5)
nRBC: 0 % (ref 0.0–0.2)

## 2021-03-01 LAB — LIPID PANEL
Cholesterol: 124 mg/dL (ref 0–200)
HDL: 60 mg/dL (ref >40)
LDL Cholesterol: 47 mg/dL (ref 0–99)
Total CHOL/HDL Ratio: 2.1 RATIO
Triglycerides: 85 mg/dL (ref <150)
VLDL: 17 mg/dL (ref 0–40)

## 2021-03-01 LAB — COMPREHENSIVE METABOLIC PANEL
ALT: 27 U/L (ref 0–44)
AST: 33 U/L (ref 15–41)
Albumin: 3 g/dL — ABNORMAL LOW (ref 3.5–5.0)
Alkaline Phosphatase: 38 U/L (ref 38–126)
Anion gap: 6 (ref 5–15)
BUN: 13 mg/dL (ref 6–20)
CO2: 24 mmol/L (ref 22–32)
Calcium: 8.7 mg/dL — ABNORMAL LOW (ref 8.9–10.3)
Chloride: 104 mmol/L (ref 98–111)
Creatinine, Ser: 1.18 mg/dL (ref 0.61–1.24)
GFR, Estimated: >60 mL/min (ref >60)
Glucose, Bld: 113 mg/dL — ABNORMAL HIGH (ref 70–99)
Potassium: 3.8 mmol/L (ref 3.5–5.1)
Sodium: 134 mmol/L — ABNORMAL LOW (ref 135–145)
Total Bilirubin: 0.6 mg/dL (ref 0.3–1.2)
Total Protein: 5.5 g/dL — ABNORMAL LOW (ref 6.5–8.1)

## 2021-03-01 LAB — PHOSPHORUS: Phosphorus: 3.5 mg/dL (ref 2.5–4.6)

## 2021-03-01 LAB — HEPARIN LEVEL (UNFRACTIONATED)
Heparin Unfractionated: <0.1 IU/mL — ABNORMAL LOW (ref 0.30–0.70)
Heparin Unfractionated: 0.39 IU/mL (ref 0.30–0.70)

## 2021-03-01 LAB — MAGNESIUM: Magnesium: 1.8 mg/dL (ref 1.7–2.4)

## 2021-03-01 MED ORDER — ESCITALOPRAM OXALATE 10 MG PO TABS
10.0000 mg | ORAL_TABLET | Freq: Every day | ORAL | 0 refills | Status: DC
Start: 2021-03-02 — End: 2021-09-10
  Filled 2021-03-01: qty 30, 30d supply, fill #0

## 2021-03-01 MED ORDER — QUETIAPINE FUMARATE 50 MG PO TABS
50.0000 mg | ORAL_TABLET | Freq: Every day | ORAL | 0 refills | Status: DC
  Filled 2021-03-01: qty 30, 30d supply, fill #0

## 2021-03-01 MED ORDER — TRAZODONE HCL 100 MG PO TABS
100.0000 mg | ORAL_TABLET | Freq: Every evening | ORAL | 0 refills | Status: DC | PRN
Start: 2021-03-01 — End: 2021-09-10
  Filled 2021-03-01: qty 30, 30d supply, fill #0

## 2021-03-01 NOTE — Plan of Care (Signed)

## 2021-03-01 NOTE — Plan of Care (Signed)
  Problem: Health Behavior/Discharge Planning: Goal: Ability to manage health-related needs will improve Outcome: Progressing   Problem: Clinical Measurements: Goal: Ability to maintain clinical measurements within normal limits will improve Outcome: Progressing Goal: Will remain free from infection Outcome: Progressing Goal: Diagnostic test results will improve Outcome: Progressing Goal: Cardiovascular complication will be avoided Outcome: Progressing   Problem: Pain Managment: Goal: General experience of comfort will improve Outcome: Progressing   Problem: Safety: Goal: Ability to remain free from injury will improve Outcome: Progressing   Problem: Activity: Goal: Ability to tolerate increased activity will improve Outcome: Progressing   Problem: Cardiac: Goal: Ability to achieve and maintain adequate cardiovascular perfusion will improve Outcome: Progressing

## 2021-03-01 NOTE — Progress Notes (Signed)
Pharmacy Note  Heparin level this am undetectable but RN notes that pt had been refusing heparin after the infusion had been started in the ED; unclear how long the heparin was off, but 6E RN resumed infusion at 2330 and has been running without interruption since then.  Will continue heparin without rate change for now and recheck level.  Vernard Gambles, PharmD, BCPS 03/01/2021 3:54 AM

## 2021-03-01 NOTE — Progress Notes (Addendum)
Pt more calm and stated will allow staff to place telemetry and connect IV fluids as well as Heparin gtt. Dr. Imogene Burn seen pt and ordered Seroquel 50 mg PO pt is agreeable to take medication too.   Pt able to answer admission questions and allow staff to complete assessment at this time.   Explained to pt about the valuables policy pt allowed valuables to be locked up with security and personal belongings to be removed from room. Pt requested to keep cell phone at bs. Pt only has personal cell phone at bedside.   Pt is aware of the safety sitter needing to remain in eyesight of pt outside room door open to where pt is fully visible.

## 2021-03-01 NOTE — Progress Notes (Addendum)
Per MD order ,pt  discharged from [facility] [place]  via  wheelchair and accompanied by sitter. Personal belongings returned to pt  , Pt states everything is in the bag.

## 2021-03-01 NOTE — Progress Notes (Addendum)
Progress Note  Patient Name: Kyle Sweeney Date of Encounter: 03/01/2021  [facility] Cardiologist: Christell Constant, MD   Subjective   Feeling well. No chest pain, sob or palpitations.    Inpatient Medications    Scheduled Meds:  aspirin EC  81 mg Oral Daily   atorvastatin  20 mg Oral Daily   escitalopram  10 mg Oral Daily   QUEtiapine  50 mg Oral QHS   Continuous Infusions:  heparin 1,100 Units/hr (03/01/21 0407)   lactated ringers 75 mL/hr at 02/28/21 1958   PRN Meds: acetaminophen, ondansetron (ZOFRAN) IV, traZODone   Vital Signs    Vitals:   02/28/21 2116 02/28/21 2130 03/01/21 0007 03/01/21 0307  BP:  120/75 126/85 111/79  Pulse:  77 71 69  Resp:  (!) Temp: 98.5 F (36.9 C)  98.3 F (36.8 C)   TempSrc: Oral  Oral   SpO2:  97% 95% 99%  Weight:      Height:        Intake/Output Summary (Last 24 hours) at 03/01/2021 0742 Last data filed at 03/01/2021 0600 Gross per 24 hour  Intake 509.59 ml  Output --  Net 509.59 ml   Last 3 Weights 02/28/2021 10/12/2020 09/03/2017  Weight (lbs) 164 lb 14.5 oz 165 lb 165 lb 11.2 oz  Weight (kg) 74.8 kg 74.844 kg 75.161 kg  Some encounter information is confidential and restricted. Go to Review Flowsheets activity to see all data.      Telemetry    Normal sinus rhythm- Personally Reviewed  ECG  No new tracing  Physical Exam   GEN: No acute distress.   Neck: No JVD Cardiac: RRR, no murmurs, rubs, or gallops.  Respiratory: Clear to auscultation bilaterally. GI: Soft, nontender, non-distended  MS: No edema; No deformity. Neuro:  Nonfocal  Psych: Normal affect   Labs    High Sensitivity Troponin:   Recent Labs  Lab 02/28/21 0149 02/28/21 0358 02/28/21 0549 02/28/21 0915  TROPONINIHS 61* 65* 56* 44*     Chemistry Recent Labs  Lab 02/27/21 1626 02/28/21 0549 03/01/21 0033  NA 138 137 134*  K 4.0 4.4 3.8  CL 105 104 104  CO2 GLUCOSE 80 84 113*  BUN CREATININE 1.08 1.16 1.18  CALCIUM 9.3 9.1 8.7*  MG  --   --  1.8  PROT 6.2*  --  5.5*  ALBUMIN 3.6  --  3.0*  AST 41  --  33  ALT 34  --  27  ALKPHOS 43  --  38  BILITOT 0.6  --  0.6  GFRNONAA >60 >60 >60  ANIONGAP Lipids  Recent Labs  Lab 03/01/21 0033  CHOL 124  TRIG 85  HDL 60  LDLCALC 47  CHOLHDL 2.1    Hematology Recent Labs  Lab 02/27/21 1626 02/28/21 0549 03/01/21 0033  WBC 11.1* 10.6* 9.4  RBC 5.01 4.89 4.49  HGB 14.8 14.4 13.1  HCT 44.3 45.3 40.8  MCV 88.4 92.6 90.9  MCH 29.5 29.4 29.2  MCHC 33.4 31.8 32.1  RDW 12.6 12.8 12.6  PLT 235 218 231   Thyroid  Recent Labs  Lab 02/27/21 1626  TSH 0.599     Radiology    CT CORONARY MORPH W/CTA COR W/SCORE W/CA W/CM &/OR WO/CM  Addendum Date: 02/28/2021   ADDENDUM REPORT: 02/28/2021 13:59 HISTORY: Chest pain/anginal equiv, ECGs or troponins abnormal EXAM:  Cardiac/Coronary  CT TECHNIQUE: The patient was scanned on a [employer]. PROTOCOL: A 120 kV prospective scan was triggered in the descending thoracic aorta at 111 HU's. Axial non-contrast 3 mm slices were carried out through the heart. The data set was analyzed on a dedicated work station and scored using the Agatston method. Gantry rotation speed was 250 msecs and collimation was .6 mm. Beta blockade and 0.8 mg of sl NTG was given. The 3D data set was reconstructed in 5% intervals of the 35-75 % of the R-R cycle. Systolic and diastolic phases were analyzed on a dedicated work station using MPR, MIP and VRT modes. The patient received 74mL OMNIPAQUE IOHEXOL 350 MG/ML SOLN contrast. FINDINGS: Image quality: Good Noise artifact is: Limited Coronary calcium score is 0. Coronary arteries: Normal coronary origins.  Right dominance. Right Coronary Artery: No detectable plaque or stenosis. Left Main Coronary Artery: No detectable plaque or stenosis. Left Anterior Descending Coronary Artery: No detectable plaque or stenosis. Left Circumflex Artery: No  detectable plaque or stenosis. Aorta: Normal size, 33 mm at the mid ascending aorta (level of the PA bifurcation) measured double oblique. No calcifications. No dissection. Aortic Valve: No calcifications. Other findings: Normal pulmonary vein drainage into the left atrium. Normal left atrial appendage without thrombus. Small accessory appendage without thrombus. Normal size of the pulmonary artery. IMPRESSION: 1. No evidence of CAD, CADRADS = 0. 2. Coronary calcium score of 0. 3. Normal coronary origins with right dominance. Electronically Signed   By: Weston Brass M.D.   On: 02/28/2021 13:59   Result Date: 02/28/2021 EXAM: OVER-READ INTERPRETATION  CT CHEST The following report is an over-read performed by radiologist Dr. Trudie Reed of [facility] on 02/28/2021. This over-read does not include interpretation of cardiac or coronary anatomy or pathology. The coronary calcium score/coronary CTA interpretation by the cardiologist is attached. COMPARISON:  None. FINDINGS: Within the visualized portions of the thorax there are no suspicious appearing pulmonary nodules or masses, there is no acute consolidative airspace disease, no pleural effusions, no pneumothorax and no lymphadenopathy. Visualized portions of the upper abdomen are unremarkable. There are no aggressive appearing lytic or blastic lesions noted in the visualized portions of the skeleton. IMPRESSION: 1. No significant incidental noncardiac findings are noted. Electronically Signed: By: Trudie Reed M.D. On: 02/28/2021 12:42   ECHOCARDIOGRAM COMPLETE  Result Date: 02/28/2021    ECHOCARDIOGRAM REPORT   Patient Name:   Kyle Sweeney Date of Exam: 02/28/2021 Medical Rec #:  [id number]      Height:       70.0 in Accession #:    [id number]     Weight:       164.9 lb Date of Birth:  19-Aug-1985      BSA:          1.923 m Patient Age:    35 years       BP:           126/83 mmHg Patient Gender: M              HR:           66 bpm.  Exam Location:  Inpatient Procedure: 2D Echo, Cardiac Doppler and Color Doppler Indications:    NSTEMI  History:        Patient has no prior history of Echocardiogram examinations.                 Abnormal ECG. Sent from behavioral health. Coughing.  Sonographer:  Roosvelt Maser RDCS Referring Phys: [id number] BRADLEY S CHOTINER IMPRESSIONS  1. Global longitudinal strain is -11.4% with apical sparing. . Left ventricular ejection fraction, by estimation, is 60 to 65%. The left ventricle has normal function. The left ventricle has no regional wall motion abnormalities. There is mild asymmetric left ventricular hypertrophy. Left ventricular diastolic parameters were normal.  2. Right ventricular systolic function is normal. The right ventricular size is normal. There is normal pulmonary artery systolic pressure.  3. Left atrial size was mildly dilated.  4. The mitral valve is normal in structure. Trivial mitral valve regurgitation.  5. Aortic valve regurgitation is not visualized. Aortic valve sclerosis is present, with no evidence of aortic valve stenosis. FINDINGS  Left Ventricle: Global longitudinal strain is -11.4% with apical sparing. Left ventricular ejection fraction, by estimation, is 60 to 65%. The left ventricle has normal function. The left ventricle has no regional wall motion abnormalities. The left ventricular internal cavity size was normal in size. There is mild asymmetric left ventricular hypertrophy. Left ventricular diastolic parameters were normal. Right Ventricle: The right ventricular size is normal. Right vetricular wall thickness was not assessed. Right ventricular systolic function is normal. There is normal pulmonary artery systolic pressure. The tricuspid regurgitant velocity is 2.31 m/s, and with an assumed right atrial pressure of 3 mmHg, the estimated right ventricular systolic pressure is 24.3 mmHg. Left Atrium: Left atrial size was mildly dilated. Right Atrium: Right atrial size was normal  in size. Pericardium: There is no evidence of pericardial effusion. Mitral Valve: The mitral valve is normal in structure. Trivial mitral valve regurgitation. Tricuspid Valve: The tricuspid valve is normal in structure. Tricuspid valve regurgitation is mild. Aortic Valve: Aortic valve regurgitation is not visualized. Aortic valve sclerosis is present, with no evidence of aortic valve stenosis. Aortic valve mean gradient measures 4.0 mmHg. Aortic valve peak gradient measures 7.0 mmHg. Aortic valve area, by VTI measures 2.64 cm. Pulmonic Valve: The pulmonic valve was normal in structure. Pulmonic valve regurgitation is mild. Aorta: The aortic root and ascending aorta are structurally normal, with no evidence of dilitation. IAS/Shunts: No atrial level shunt detected by color flow Doppler.  LEFT VENTRICLE PLAX 2D LVIDd:         4.42 cm   Diastology LVIDs:         2.80 cm   LV e' medial:    7.72 cm/s LV PW:         1.10 cm   LV E/e' medial:  6.9 LV IVS:        1.47 cm   LV e' lateral:   12.70 cm/s LVOT diam:     2.00 cm   LV E/e' lateral: 4.2 LV SV:         71 LV SV Index:   37 LVOT Area:     3.14 cm                           3D Volume EF:                          3D EF:        54 %                          LV EDV:       122 ml  LV ESV:       56 ml                          LV SV:        66 ml RIGHT VENTRICLE RV Basal diam:  3.70 cm LEFT ATRIUM           Index        RIGHT ATRIUM           Index LA diam:      3.10 cm 1.61 cm/m   RA Area:     16.80 cm LA Vol (A2C): 58.5 ml 30.42 ml/m  RA Volume:   44.90 ml  23.35 ml/m LA Vol (A4C): 72.2 ml 37.55 ml/m  AORTIC VALVE AV Area (Vmax):    2.67 cm AV Area (Vmean):   2.58 cm AV Area (VTI):     2.64 cm AV Vmax:           132.00 cm/s AV Vmean:          85.900 cm/s AV VTI:            0.268 m AV Peak Grad:      7.0 mmHg AV Mean Grad:      4.0 mmHg LVOT Vmax:         112.00 cm/s LVOT Vmean:        70.600 cm/s LVOT VTI:          0.225 m LVOT/AV VTI  ratio: 0.84  AORTA Ao Root diam: 3.90 cm Ao Asc diam:  3.40 cm MITRAL VALVE               TRICUSPID VALVE MV Area (PHT): 3.72 cm    TR Peak grad:   21.3 mmHg MV Decel Time: 204 msec    TR Vmax:        231.00 cm/s MV E velocity: 53.30 cm/s MV A velocity: 45.90 cm/s  SHUNTS MV E/A ratio:  1.16        Systemic VTI:  0.22 m                            Systemic Diam: 2.00 cm Dietrich Pates MD Electronically signed by Dietrich Pates MD Signature Date/Time: 02/28/2021/7:32:09 PM    Final     Cardiac Studies   Echo 02/28/2021  1. Global longitudinal strain is -11.4% with apical sparing. . Left  ventricular ejection fraction, by estimation, is 60 to 65%. The left  ventricle has normal function. The left ventricle has no regional wall  motion abnormalities. There is mild  asymmetric left ventricular hypertrophy. Left ventricular diastolic  parameters were normal.   2. Right ventricular systolic function is normal. The right ventricular  size is normal. There is normal pulmonary artery systolic pressure.   3. Left atrial size was mildly dilated.   4. The mitral valve is normal in structure. Trivial mitral valve  regurgitation.   5. Aortic valve regurgitation is not visualized. Aortic valve sclerosis  is present, with no evidence of aortic valve stenosis.   Coronary CT 02/28/21 FINDINGS: Image quality: Good   Noise artifact is: Limited   Coronary calcium score is 0.   Coronary arteries: Normal coronary origins.  Right dominance.   Right Coronary Artery: No detectable plaque or stenosis.   Left Main Coronary Artery: No detectable plaque or stenosis.   Left Anterior Descending Coronary Artery: No detectable plaque  or stenosis.   Left Circumflex Artery: No detectable plaque or stenosis.   Aorta: Normal size, 33 mm at the mid ascending aorta (level of the PA bifurcation) measured double oblique. No calcifications. No dissection.   Aortic Valve: No calcifications.   Other findings:   Normal  pulmonary vein drainage into the left atrium.   Normal left atrial appendage without thrombus. Small accessory appendage without thrombus.   Normal size of the pulmonary artery.   IMPRESSION: 1. No evidence of CAD, CADRADS = 0.   2. Coronary calcium score of 0.   3. Normal coronary origins with right dominance.  Patient Profile     35 y.o. male  with a hx of past medical history of polysubstance abuse, bipolar 1 disorder, suicidal ideation who is being seen for the evaluation of elevated troponin/chest pain at the request of Dr. Joseph Art.  Assessment & Plan    Chest pain/elevated troponin:  -Sensitivity troponin 61>> 65>> 56>>44.  EKG noted to be abnormal and code STEMI called initially at behavioral health with patient transferred to [facility]. EKG showed new T wave inversion in lead I through lead III, as well as V6.  Canceled code STEMI by Dr. Okey Dupre. -Coronary CT that evidence of CAD.  Calcium score of 0. -Echocardiogram showed preserved LV function without wall motion abnormality -Stop IV heparin, aspirin and Lipitor -No further cardiac work-up needed.  Polysubstance abuse: -Urine drug screen positive for cocaine and marijuana on admission -Strongly encourage cessation  Suicidal ideation/bipolar disorder: -Transferred from behavioral health - Per primary team  Will sign off.  Call with questions.  Cardiology follow-up as needed.  For questions or updates, please contact [facility] Please consult [email or web address] for contact info under        SignedManson Passey, PA  03/01/2021, 7:42 AM     Personally seen and examined. Agree with APP above with the following comments: Briefly cocaine use in the setting of bipolar who presents with query or ECG changes and elevated troponin. Patient notes no symptoms.  No CP, SOB, PND, orthopnea. Exam notable for no murmurs.  Benign cardiac exam. Labs notable for down-trending troponin. Personally reviewed relevant tests; CT with  non CAC or CAD; mild LVH that would explain the EKG changes Would recommend  - no further cardiac workup - we have discussed substance abuse at length - PRN cardiology F/u  [facility] will sign off.   Medication Recommendations:  None Other recommendations (labs, testing, etc):  substance abuse cessation Follow up as an outpatient:  PRN only  For questions or updates, please contact [facility] Please consult [email or web address] for contact info under Cardiology/STEMI.      Signed, Christell Constant, MD  03/01/2021, 11:47 AM

## 2021-03-01 NOTE — BH Assessment (Signed)
Care Management - Follow Up Discharges   Per chart review, patient was transferred from the [facility] to [facility] emergency room due to chest pains on 02-27-21.

## 2021-03-01 NOTE — Progress Notes (Signed)
   03/01/21 1600  Clinical Encounter Type  Visited With Patient not available  Visit Type Other (Comment)  Referral From Nurse  Consult/Referral To Chaplain   Chaplain Dondra Spry responded to the consult request; however the patient was discharged. This note was prepared by Deneen Harts, M.Div..  For questions please contact by phone [phone].

## 2021-03-01 NOTE — Discharge Summary (Signed)
Physician Discharge Summary  [name] BJY:[id number] DOB: December 20, 1985 DOA: 02/27/2021  PCP: Patient, No Pcp Per (Inactive)  Admit date: 02/27/2021 Discharge date: 03/01/2021  Time spent: 35 minutes  Recommendations for Outpatient Follow-up:   Chest pain/elevated troponin:  -Sensitivity troponin 61>> 65>> 56>>44.  EKG noted to be abnormal and code STEMI called initially at behavioral health with patient transferred to [facility]. EKG showed new T wave inversion in lead I through lead III, as well as V6.  Canceled code STEMI by Dr. Okey Dupre. -Coronary CT that evidence of CAD.  Calcium score of 0. -Echocardiogram showed preserved LV function without wall motion abnormality -Stop IV heparin, aspirin and Lipitor -No further cardiac work-up needed.  Cardiology has signed off   Polysubstance abuse: -Urine drug screen positive for cocaine and marijuana on admission -Strongly encourage cessation   Suicidal ideation/bipolar disorder: -Transferred from behavioral health -11/29 per psychiatry: Overall patient appears psychiatrically stable to return home.   Discharge Diagnoses:  Principal Problem:   NSTEMI (non-ST elevated myocardial infarction) (HCC) Active Problems:   Polysubstance abuse (HCC)   Bipolar 1 disorder (HCC)   Suicidal ideations   Discharge Condition: Stable  Diet recommendation: Heart healthy  Filed Weights   02/28/21 0201  Weight: 74.8 kg    History of present illness:   Kyle Sweeney is a 35 y.o. BM PMHx Bipolar disorder, polysubstance abuse who went to behavioral health yesterday for suicidal ideation and was found to have tachycardia and an abnormal EKG so he was sent to the emergency room for further evaluation.  In the emergency room he had elevated troponin levels of 60.  He denies any chest pain, shortness of breath nausea vomiting or diarrhea.  He states he feels well and has no physical complaints.  He reports he does not have any suicidal ideation at this time  but does admit that the thoughts come and go when they come they can become very overwhelming.  He has been on Paxil and Seroquel in the past but has not been on medication for the last 6 months.  When he had significant thoughts of committing suicide yesterday he went to [facility] health for assistance resumed on medications. To smoking 2 packs of cigarettes this week and occasionally smokes marijuana.  He does admit that he smokes marijuana 2 days ago.  He states he did not use any cocaine but he has in the past.  He is not sure if the marijuana that he was given was laced with cocaine.   ED Course: Mr. Kail has been hemodynamically stable in the emergency room.  Found to have an abnormal EKG and had elevated troponin levels of 61 and 65.  CBC CMP were unremarkable.  Lipid panel showed cholesterol of 145 HDL of 80 LDL 59 triglycerides of 29.  TSH 0.599.  COVID test negative.  HIV test negative.  Alcohol level negative.  UDS positive for cocaine and marijuana  Hospital Course:  See above   Consultations: Dr.Mahesh Chandrasekhar cardiology Dr.Jai B McQuilla psychiatry    Discharge Exam: Vitals:   02/28/21 2130 03/01/21 0007 03/01/21 0307 03/01/21 0824  BP: 120/75 126/85 111/79 137/79  Pulse:  71 69 71  Resp:  16 18 (!) 21  Temp:  98.3 F (36.8 C)  98.5 F (36.9 C)  TempSrc:  Oral  Oral  SpO2: 97% 95% 99% 100%  Weight:      Height:       Physical Exam:  General: No acute respiratory distress Eyes: negative scleral  hemorrhage, negative anisocoria, negative icterus ENT: Negative Runny nose, negative gingival bleeding, Neck:  Negative scars, masses, torticollis, lymphadenopathy, JVD Lungs: Clear to auscultation bilaterally without wheezes or crackles Cardiovascular: Regular rate and rhythm without murmur gallop or rub normal S1 and S2 Psychiatric:  Negative depression, negative anxiety, negative fatigue, negative mania     Discharge Instructions   Allergies as of 03/01/2021    No Known Allergies      Medication List     STOP taking these medications    folic acid 1 MG tablet Commonly known as: FOLVITE   PARoxetine 10 MG tablet Commonly known as: PAXIL   thiamine 100 MG tablet       TAKE these medications    acetaminophen 500 MG tablet Commonly known as: TYLENOL Take 1,000 mg by mouth every 6 (six) hours as needed for moderate pain or headache.   escitalopram 10 MG tablet Commonly known as: LEXAPRO Take 1 tablet (10 mg total) by mouth daily. Start taking on: March 02, 2021   multivitamin Tabs tablet Take 1 tablet by mouth daily.   QUEtiapine 50 MG tablet Commonly known as: SEROQUEL Take 1 tablet (50 mg total) by mouth at bedtime. What changed:  medication strength how much to take   traZODone 100 MG tablet Commonly known as: DESYREL Take 1 tablet (100 mg total) by mouth at bedtime as needed for sleep.       No Known Allergies  Follow-up Information     [facility]. Call.   Specialty: [facility] information: [address] [place] [address] [phone]         [facility]. Call.   Specialty: Behavioral Health Contact information: [address] [place] [address] [place] [address] [phone]                 The results of significant diagnostics from this hospitalization (including imaging, microbiology, ancillary and laboratory) are listed below for reference.    Significant Diagnostic Studies: CT CORONARY MORPH W/CTA COR W/SCORE W/CA W/CM &/OR WO/CM  Addendum Date: 02/28/2021   ADDENDUM REPORT: 02/28/2021 13:59 HISTORY: Chest pain/anginal equiv, ECGs or troponins abnormal EXAM: Cardiac/Coronary  CT TECHNIQUE: The patient was scanned on a [employer]. PROTOCOL: A 120 kV prospective scan was triggered in the descending thoracic aorta at 111 HU's. Axial non-contrast 3 mm slices were  carried out through the heart. The data set was analyzed on a dedicated work station and scored using the Agatston method. Gantry rotation speed was 250 msecs and collimation was .6 mm. Beta blockade and 0.8 mg of sl NTG was given. The 3D data set was reconstructed in 5% intervals of the 35-75 % of the R-R cycle. Systolic and diastolic phases were analyzed on a dedicated work station using MPR, MIP and VRT modes. The patient received 65mL OMNIPAQUE IOHEXOL 350 MG/ML SOLN contrast. FINDINGS: Image quality: Good Noise artifact is: Limited Coronary calcium score is 0. Coronary arteries: Normal coronary origins.  Right dominance. Right Coronary Artery: No detectable plaque or stenosis. Left Main Coronary Artery: No detectable plaque or stenosis. Left Anterior Descending Coronary Artery: No detectable plaque or stenosis. Left Circumflex Artery: No detectable plaque or stenosis. Aorta: Normal size, 33 mm at the mid ascending aorta (level of the PA bifurcation) measured double oblique. No calcifications. No dissection. Aortic Valve: No calcifications. Other findings: Normal pulmonary vein drainage into the left atrium. Normal left atrial appendage without thrombus.  Small accessory appendage without thrombus. Normal size of the pulmonary artery. IMPRESSION: 1. No evidence of CAD, CADRADS = 0. 2. Coronary calcium score of 0. 3. Normal coronary origins with right dominance. Electronically Signed   By: Weston Brass M.D.   On: 02/28/2021 13:59   Result Date: 02/28/2021 EXAM: OVER-READ INTERPRETATION  CT CHEST The following report is an over-read performed by radiologist Dr. Trudie Reed of [facility] on 02/28/2021. This over-read does not include interpretation of cardiac or coronary anatomy or pathology. The coronary calcium score/coronary CTA interpretation by the cardiologist is attached. COMPARISON:  None. FINDINGS: Within the visualized portions of the thorax there are no suspicious appearing  pulmonary nodules or masses, there is no acute consolidative airspace disease, no pleural effusions, no pneumothorax and no lymphadenopathy. Visualized portions of the upper abdomen are unremarkable. There are no aggressive appearing lytic or blastic lesions noted in the visualized portions of the skeleton. IMPRESSION: 1. No significant incidental noncardiac findings are noted. Electronically Signed: By: Trudie Reed M.D. On: 02/28/2021 12:42   ECHOCARDIOGRAM COMPLETE  Result Date: 02/28/2021    ECHOCARDIOGRAM REPORT   Patient Name:   AMONTAE NG Date of Exam: 02/28/2021 Medical Rec #:  [id number]      Height:       70.0 in Accession #:    [id number]     Weight:       164.9 lb Date of Birth:  06-Feb-1986      BSA:          1.923 m Patient Age:    35 years       BP:           126/83 mmHg Patient Gender: M              HR:           66 bpm. Exam Location:  Inpatient Procedure: 2D Echo, Cardiac Doppler and Color Doppler Indications:    NSTEMI  History:        Patient has no prior history of Echocardiogram examinations.                 Abnormal ECG. Sent from behavioral health. Coughing.  Sonographer:    Roosvelt Maser RDCS Referring Phys: [id number] BRADLEY S CHOTINER IMPRESSIONS  1. Global longitudinal strain is -11.4% with apical sparing. . Left ventricular ejection fraction, by estimation, is 60 to 65%. The left ventricle has normal function. The left ventricle has no regional wall motion abnormalities. There is mild asymmetric left ventricular hypertrophy. Left ventricular diastolic parameters were normal.  2. Right ventricular systolic function is normal. The right ventricular size is normal. There is normal pulmonary artery systolic pressure.  3. Left atrial size was mildly dilated.  4. The mitral valve is normal in structure. Trivial mitral valve regurgitation.  5. Aortic valve regurgitation is not visualized. Aortic valve sclerosis is present, with no evidence of aortic valve stenosis. FINDINGS  Left  Ventricle: Global longitudinal strain is -11.4% with apical sparing. Left ventricular ejection fraction, by estimation, is 60 to 65%. The left ventricle has normal function. The left ventricle has no regional wall motion abnormalities. The left ventricular internal cavity size was normal in size. There is mild asymmetric left ventricular hypertrophy. Left ventricular diastolic parameters were normal. Right Ventricle: The right ventricular size is normal. Right vetricular wall thickness was not assessed. Right ventricular systolic function is normal. There is normal pulmonary artery systolic pressure. The tricuspid regurgitant velocity is 2.31 m/s, and  with an assumed right atrial pressure of 3 mmHg, the estimated right ventricular systolic pressure is 24.3 mmHg. Left Atrium: Left atrial size was mildly dilated. Right Atrium: Right atrial size was normal in size. Pericardium: There is no evidence of pericardial effusion. Mitral Valve: The mitral valve is normal in structure. Trivial mitral valve regurgitation. Tricuspid Valve: The tricuspid valve is normal in structure. Tricuspid valve regurgitation is mild. Aortic Valve: Aortic valve regurgitation is not visualized. Aortic valve sclerosis is present, with no evidence of aortic valve stenosis. Aortic valve mean gradient measures 4.0 mmHg. Aortic valve peak gradient measures 7.0 mmHg. Aortic valve area, by VTI measures 2.64 cm. Pulmonic Valve: The pulmonic valve was normal in structure. Pulmonic valve regurgitation is mild. Aorta: The aortic root and ascending aorta are structurally normal, with no evidence of dilitation. IAS/Shunts: No atrial level shunt detected by color flow Doppler.  LEFT VENTRICLE PLAX 2D LVIDd:         4.42 cm   Diastology LVIDs:         2.80 cm   LV e' medial:    7.72 cm/s LV PW:         1.10 cm   LV E/e' medial:  6.9 LV IVS:        1.47 cm   LV e' lateral:   12.70 cm/s LVOT diam:     2.00 cm   LV E/e' lateral: 4.2 LV SV:         71 LV SV  Index:   37 LVOT Area:     3.14 cm                           3D Volume EF:                          3D EF:        54 %                          LV EDV:       122 ml                          LV ESV:       56 ml                          LV SV:        66 ml RIGHT VENTRICLE RV Basal diam:  3.70 cm LEFT ATRIUM           Index        RIGHT ATRIUM           Index LA diam:      3.10 cm 1.61 cm/m   RA Area:     16.80 cm LA Vol (A2C): 58.5 ml 30.42 ml/m  RA Volume:   44.90 ml  23.35 ml/m LA Vol (A4C): 72.2 ml 37.55 ml/m  AORTIC VALVE AV Area (Vmax):    2.67 cm AV Area (Vmean):   2.58 cm AV Area (VTI):     2.64 cm AV Vmax:           132.00 cm/s AV Vmean:          85.900 cm/s AV VTI:            0.268 m AV Peak Grad:  7.0 mmHg AV Mean Grad:      4.0 mmHg LVOT Vmax:         112.00 cm/s LVOT Vmean:        70.600 cm/s LVOT VTI:          0.225 m LVOT/AV VTI ratio: 0.84  AORTA Ao Root diam: 3.90 cm Ao Asc diam:  3.40 cm MITRAL VALVE               TRICUSPID VALVE MV Area (PHT): 3.72 cm    TR Peak grad:   21.3 mmHg MV Decel Time: 204 msec    TR Vmax:        231.00 cm/s MV E velocity: 53.30 cm/s MV A velocity: 45.90 cm/s  SHUNTS MV E/A ratio:  1.16        Systemic VTI:  0.22 m                            Systemic Diam: 2.00 cm Dietrich Pates MD Electronically signed by Dietrich Pates MD Signature Date/Time: 02/28/2021/7:32:09 PM    Final     Microbiology: Recent Results (from the past 240 hour(s))  Resp Panel by RT-PCR (Flu A&B, Covid) Nasopharyngeal Swab     Status: None   Collection Time: 02/27/21  4:16 PM   Specimen: Nasopharyngeal Swab; Nasopharyngeal(NP) swabs in vial transport medium  Result Value Ref Range Status   SARS Coronavirus 2 by RT PCR NEGATIVE NEGATIVE Final    Comment: (NOTE) SARS-CoV-2 target nucleic acids are NOT DETECTED.  The SARS-CoV-2 RNA is generally detectable in upper respiratory specimens during the acute phase of infection. The lowest concentration of SARS-CoV-2 viral copies this assay can  detect is 138 copies/mL. A negative result does not preclude SARS-Cov-2 infection and should not be used as the sole basis for treatment or other patient management decisions. A negative result may occur with  improper specimen collection/handling, submission of specimen other than nasopharyngeal swab, presence of viral mutation(s) within the areas targeted by this assay, and inadequate number of viral copies(<138 copies/mL). A negative result must be combined with clinical observations, patient history, and epidemiological information. The expected result is Negative.  Fact Sheet for Patients:  BloggerCourse.com  Fact Sheet for Healthcare Providers:  SeriousBroker.it  This test is no t yet approved or cleared by the [place] FDA and  has been authorized for detection and/or diagnosis of SARS-CoV-2 by FDA under an Emergency Use Authorization (EUA). This EUA will remain  in effect (meaning this test can be used) for the duration of the COVID-19 declaration under Section 564(b)(1) of the Act, 21 U.S.C.section 360bbb-3(b)(1), unless the authorization is terminated  or revoked sooner.       Influenza A by PCR NEGATIVE NEGATIVE Final   Influenza B by PCR NEGATIVE NEGATIVE Final    Comment: (NOTE) The Xpert Xpress SARS-CoV-2/FLU/RSV plus assay is intended as an aid in the diagnosis of influenza from Nasopharyngeal swab specimens and should not be used as a sole basis for treatment. Nasal washings and aspirates are unacceptable for Xpert Xpress SARS-CoV-2/FLU/RSV testing.  Fact Sheet for Patients: BloggerCourse.com  Fact Sheet for Healthcare Providers: SeriousBroker.it  This test is not yet approved or cleared by the [place] FDA and has been authorized for detection and/or diagnosis of SARS-CoV-2 by FDA under an Emergency Use Authorization (EUA). This EUA will remain in  effect (meaning this test can be used) for the duration of the COVID-19  declaration under Section 564(b)(1) of the Act, 21 U.S.C. section 360bbb-3(b)(1), unless the authorization is terminated or revoked.  Performed at [facility], [address], [place], [place] [address]      Labs: Basic Metabolic Panel: Recent Labs  Lab 02/27/21 1626 02/28/21 0549 03/01/21 0033  NA 138 137 134*  K 4.0 4.4 3.8  CL 105 104 104  CO2 24 24 24   GLUCOSE 80 84 113*  BUN 8 8 13   CREATININE 1.08 1.16 1.18  CALCIUM 9.3 9.1 8.7*  MG  --   --  1.8  PHOS  --   --  3.5   Liver Function Tests: Recent Labs  Lab 02/27/21 1626 03/01/21 0033  AST 41 33  ALT 34 27  ALKPHOS 43 38  BILITOT 0.6 0.6  PROT 6.2* 5.5*  ALBUMIN 3.6 3.0*   No results for input(s): LIPASE, AMYLASE in the last 168 hours. No results for input(s): AMMONIA in the last 168 hours. CBC: Recent Labs  Lab 02/27/21 1626 02/28/21 0549 03/01/21 0033  WBC 11.1* 10.6* 9.4  NEUTROABS 8.1*  --  5.9  HGB 14.8 14.4 13.1  HCT 44.3 45.3 40.8  MCV 88.4 92.6 90.9  PLT 235 218 231   Cardiac Enzymes: No results for input(s): CKTOTAL, CKMB, CKMBINDEX, TROPONINI in the last 168 hours. BNP: BNP (last 3 results) No results for input(s): BNP in the last 8760 hours.  ProBNP (last 3 results) No results for input(s): PROBNP in the last 8760 hours.  CBG: No results for input(s): GLUCAP in the last 168 hours.     Signed:  03/02/21, MD [facility]

## 2021-03-01 NOTE — Consult Note (Signed)
[facility] Face-to-Face Psychiatry Consult   Reason for Consult:  Dispo Referring Physician:  Carolyne Littles, MD Patient Identification: Kyle Sweeney MRN:  [id number] Principal Diagnosis: NSTEMI (non-ST elevated myocardial infarction) [facility]) Diagnosis:  Principal Problem:   NSTEMI (non-ST elevated myocardial infarction) [facility]) Active Problems:   Polysubstance abuse (HCC)   Bipolar 1 disorder (HCC)   Suicidal ideations   Total Time spent with patient: 30 minutes  Subjective:   Kyle Sweeney is a 35 y.o. male patient admitted with elevated Trps.  HPI:  On assessment today patient Aox4. Patient reports that he "had a bit of an attitude" last night and has been more irritable lately. Patient reports that after receiving Seroquel last night he slept well, for the first time in a few nights and also felt better overall. Patient reports that he has Bipolar disorder and he had voluntarily taken himself to [facility] due to being concerned about having SI. Patient reports that he went to [facility] knowing that he may have to stay a few days, but became upset when he had to be transferred to [facility] due to cardiac concerns. Patient reports that his hospitalist has discussed with him that his presentation was likely 2/2 to cocaine. Patient endorsed on assessment today that it is possible that [facility] he had at a party may have been laced with cocaine. Patient reports that he is feeling better today, and denies SI, HI, and AVH. Patient reports that he slept well with the Seroquel provided last night and felt that the dose was adequate as he has not been taking his medications for a few months. Patient reports that prior to presentation he had not been sleeping very well, decreased appetite and feeling overly anxious and hypervigilant. Patient reports "my PTSD and Bipolar were causing problems." Patient reports that he has never been dx with PTSD, but he believes that experiences while incarcerated and during his childhood have made him  more hypervigilant. Patient reports that he interested in starting again with a OP psychiatrist but endorses he wants to have a consistent provider and he will likely gain insurance in 15 days with his new job.   Past Psychiatric History:  Bipolar 1 disorder, hx of stability on Paxil 10mg  and Seroquel 100mg  QHS. No hx of psychiatric hospitalization  Past Medical History:  Past Medical History:  Diagnosis Date   Anxiety    Bipolar 1 disorder (HCC)    Cocaine abuse (HCC)    Herpes    Polysubstance abuse (HCC)    History reviewed. No pertinent surgical history. Family History: History reviewed. No pertinent family history. Family Psychiatric  History: Not assessed Social History:  Social History   Substance and Sexual Activity  Alcohol Use Yes   Comment: occasional use     Social History   Substance and Sexual Activity  Drug Use Yes   Types: Cocaine, Marijuana   Comment: pt states he does not currently use cocaine    Social History   Socioeconomic History   Marital status: Single    Spouse name: Not on file   Number of children: Not on file   Years of education: Not on file   Highest education level: Not on file  Occupational History   Not on file  Tobacco Use   Smoking status: Every Day    Types: Cigarettes   Smokeless tobacco: Never  Vaping Use   Vaping Use: Never used  Substance and Sexual Activity   Alcohol use: Yes    Comment: occasional use  Drug use: Yes    Types: Cocaine, Marijuana    Comment: pt states he does not currently use cocaine   Sexual activity: Not Currently    Birth control/protection: None  Other Topics Concern   Not on file  Social History Narrative   Not on file   Social Determinants of Health   Financial Resource Strain: Not on file  Food Insecurity: Not on file  Transportation Needs: Not on file  Physical Activity: Not on file  Stress: Not on file  Social Connections: Not on file   Additional Social History:    Allergies:   No Known Allergies  Labs:  Results for orders placed or performed during the hospital encounter of 02/27/21 (from the past 48 hour(s))  Troponin I (High Sensitivity)     Status: Abnormal   Collection Time: 02/28/21  1:49 AM  Result Value Ref Range   Troponin I (High Sensitivity) 61 (H) <18 ng/L    Comment: (NOTE) Elevated high sensitivity troponin I (hsTnI) values and significant  changes across serial measurements may suggest ACS but many other  chronic and acute conditions are known to elevate hsTnI results.  Refer to the "Links" section for chest pain algorithms and additional  guidance. Performed at [facility], [address], [place], [place] [address]   Troponin I (High Sensitivity)     Status: Abnormal   Collection Time: 02/28/21  3:58 AM  Result Value Ref Range   Troponin I (High Sensitivity) 65 (H) <18 ng/L    Comment: (NOTE) Elevated high sensitivity troponin I (hsTnI) values and significant  changes across serial measurements may suggest ACS but many other  chronic and acute conditions are known to elevate hsTnI results.  Refer to the "Links" section for chest pain algorithms and additional  guidance. Performed at [facility], [address], [place], [place] [address]   Troponin I (High Sensitivity)     Status: Abnormal   Collection Time: 02/28/21  5:49 AM  Result Value Ref Range   Troponin I (High Sensitivity) 56 (H) <18 ng/L    Comment: (NOTE) Elevated high sensitivity troponin I (hsTnI) values and significant  changes across serial measurements may suggest ACS but many other  chronic and acute conditions are known to elevate hsTnI results.  Refer to the "Links" section for chest pain algorithms and additional  guidance. Performed at [facility], [address], [place], [place] [address]   Basic metabolic panel     Status: None   Collection Time: 02/28/21  5:49 AM  Result Value Ref Range   Sodium 137 135 - 145 mmol/L   Potassium 4.4  3.5 - 5.1 mmol/L   Chloride 104 98 - 111 mmol/L   CO2 24 22 - 32 mmol/L   Glucose, Bld 84 70 - 99 mg/dL    Comment: Glucose reference range applies only to samples taken after fasting for at least 8 hours.   BUN 8 6 - 20 mg/dL   Creatinine, Ser 8.92 0.61 - 1.24 mg/dL   Calcium 9.1 8.9 - 11.9 mg/dL   GFR, Estimated >41 >74 mL/min    Comment: (NOTE) Calculated using the CKD-EPI Creatinine Equation (2021)    Anion gap 9 5 - 15    Comment: Performed at [facility] Lab, [address], [place], [place] [address]  CBC     Status: Abnormal   Collection Time: 02/28/21  5:49 AM  Result Value Ref Range   WBC  10.6 (H) 4.0 - 10.5 K/uL   RBC 4.89 4.22 - 5.81 MIL/uL   Hemoglobin 14.4 13.0 - 17.0 g/dL   HCT 40.9 81.1 - 91.4 %   MCV 92.6 80.0 - 100.0 fL   MCH 29.4 26.0 - 34.0 pg   MCHC 31.8 30.0 - 36.0 g/dL   RDW 78.2 95.6 - 21.3 %   Platelets 218 150 - 400 K/uL   nRBC 0.0 0.0 - 0.2 %    Comment: Performed at [facility] Lab, [address], [place], [place] [address]  Urine rapid drug screen (hosp performed)     Status: Abnormal   Collection Time: 02/28/21  8:26 AM  Result Value Ref Range   Opiates NONE DETECTED NONE DETECTED   Cocaine POSITIVE (A) NONE DETECTED   Benzodiazepines NONE DETECTED NONE DETECTED   Amphetamines NONE DETECTED NONE DETECTED   Tetrahydrocannabinol POSITIVE (A) NONE DETECTED   Barbiturates NONE DETECTED NONE DETECTED    Comment: (NOTE) DRUG SCREEN FOR MEDICAL PURPOSES ONLY.  IF CONFIRMATION IS NEEDED FOR ANY PURPOSE, NOTIFY LAB WITHIN 5 DAYS.  LOWEST DETECTABLE LIMITS FOR URINE DRUG SCREEN Drug Class                     Cutoff (ng/mL) Amphetamine and metabolites    1000 Barbiturate and metabolites    200 Benzodiazepine                 200 Tricyclics and metabolites     300 Opiates and metabolites        300 Cocaine and metabolites        300 THC                            50 Performed at [facility] Lab, [address] [place],  [place] [address]   Troponin I (High Sensitivity)     Status: Abnormal   Collection Time: 02/28/21  9:15 AM  Result Value Ref Range   Troponin I (High Sensitivity) 44 (H) <18 ng/L    Comment: (NOTE) Elevated high sensitivity troponin I (hsTnI) values and significant  changes across serial measurements may suggest ACS but many other  chronic and acute conditions are known to elevate hsTnI results.  Refer to the "Links" section for chest pain algorithms and additional  guidance. Performed at [facility], [address] [place], [place] [address]   Heparin level (unfractionated)     Status: None   Collection Time: 02/28/21 12:00 PM  Result Value Ref Range   Heparin Unfractionated 0.31 0.30 - 0.70 IU/mL    Comment: (NOTE) The clinical reportable range upper limit is being lowered to >1.10 to align with the FDA approved guidance for the current laboratory assay.  If heparin results are below expected values, and patient dosage has  been confirmed, suggest follow up testing of antithrombin III levels. Performed at [facility] Lab, [address] [place], [place] [address]   Heparin level (unfractionated)     Status: Abnormal   Collection Time: 03/01/21 12:33 AM  Result Value Ref Range   Heparin Unfractionated <0.10 (L) 0.30 - 0.70 IU/mL    Comment: (NOTE) The clinical reportable range upper limit is being lowered to >1.10 to align with the FDA approved guidance for the current laboratory assay.  If heparin results are below expected values, and patient dosage has  been confirmed, suggest follow up testing of antithrombin  III levels. Performed at [facility], [address] [place], [place] [address]   Lipid panel     Status: None   Collection Time: 03/01/21 12:33 AM  Result Value Ref Range   Cholesterol 124 0 - 200 mg/dL   Triglycerides 85 <469 mg/dL   HDL 60 >62 mg/dL   Total CHOL/HDL Ratio 2.1 RATIO   VLDL 17 0 - 40 mg/dL   LDL Cholesterol 47 0 - 99 mg/dL     Comment:        Total Cholesterol/HDL:CHD Risk Coronary Heart Disease Risk Table                     Men   Women  1/2 Average Risk   3.4   3.3  Average Risk       5.0   4.4  2 X Average Risk   9.6   7.1  3 X Average Risk  23.4   11.0        Use the calculated Patient Ratio above and the CHD Risk Table to determine the patient's CHD Risk.        ATP III CLASSIFICATION (LDL):  <100     mg/dL   Optimal  952-841  mg/dL   Near or Above                    Optimal  130-159  mg/dL   Borderline  324-401  mg/dL   High  >027     mg/dL   Very High Performed at [facility], [address], [place], [place] [address]   Comprehensive metabolic panel     Status: Abnormal   Collection Time: 03/01/21 12:33 AM  Result Value Ref Range   Sodium 134 (L) 135 - 145 mmol/L   Potassium 3.8 3.5 - 5.1 mmol/L   Chloride 104 98 - 111 mmol/L   CO2 24 22 - 32 mmol/L   Glucose, Bld 113 (H) 70 - 99 mg/dL    Comment: Glucose reference range applies only to samples taken after fasting for at least 8 hours.   BUN 13 6 - 20 mg/dL   Creatinine, Ser 4.40 0.61 - 1.24 mg/dL   Calcium 8.7 (L) 8.9 - 10.3 mg/dL   Total Protein 5.5 (L) 6.5 - 8.1 g/dL   Albumin 3.0 (L) 3.5 - 5.0 g/dL   AST 33 15 - 41 U/L   ALT 27 0 - 44 U/L   Alkaline Phosphatase 38 38 - 126 U/L   Total Bilirubin 0.6 0.3 - 1.2 mg/dL   GFR, Estimated >34 >74 mL/min    Comment: (NOTE) Calculated using the CKD-EPI Creatinine Equation (2021)    Anion gap 6 5 - 15    Comment: Performed at [facility] Lab, [address], [place], [place] [address]  Magnesium     Status: None   Collection Time: 03/01/21 12:33 AM  Result Value Ref Range   Magnesium 1.8 1.7 - 2.4 mg/dL    Comment: Performed at [facility] Lab, [address] [place], [place] [address]  Phosphorus     Status: None   Collection Time: 03/01/21 12:33 AM  Result Value Ref Range   Phosphorus 3.5 2.5 - 4.6 mg/dL    Comment: Performed at [facility], [address] [place], [place] [address]  CBC with Differential/Platelet     Status: None   Collection Time: 03/01/21 12:33 AM  Result  Value Ref Range   WBC 9.4 4.0 - 10.5 K/uL   RBC 4.49 4.22 - 5.81 MIL/uL   Hemoglobin 13.1 13.0 - 17.0 g/dL   HCT 73.7 10.6 - 26.9 %   MCV 90.9 80.0 - 100.0 fL   MCH 29.2 26.0 - 34.0 pg   MCHC 32.1 30.0 - 36.0 g/dL   RDW 48.5 46.2 - 70.3 %   Platelets 231 150 - 400 K/uL   nRBC 0.0 0.0 - 0.2 %   Neutrophils Relative % 64 %   Neutro Abs 5.9 1.7 - 7.7 K/uL   Lymphocytes Relative 30 %   Lymphs Abs 2.9 0.7 - 4.0 K/uL   Monocytes Relative 6 %   Monocytes Absolute 0.5 0.1 - 1.0 K/uL   Eosinophils Relative 0 %   Eosinophils Absolute 0.0 0.0 - 0.5 K/uL   Basophils Relative 0 %   Basophils Absolute 0.0 0.0 - 0.1 K/uL   Immature Granulocytes 0 %   Abs Immature Granulocytes 0.03 0.00 - 0.07 K/uL    Comment: Performed at [facility], [address], [place], [place] [address]  Heparin level (unfractionated)     Status: None   Collection Time: 03/01/21  7:45 AM  Result Value Ref Range   Heparin Unfractionated 0.39 0.30 - 0.70 IU/mL    Comment: (NOTE) The clinical reportable range upper limit is being lowered to >1.10 to align with the FDA approved guidance for the current laboratory assay.  If heparin results are below expected values, and patient dosage has  been confirmed, suggest follow up testing of antithrombin III levels. Performed at [facility], [address], [place], [place] [address]     Current Facility-Administered Medications  Medication Dose Route Frequency Provider Last Rate Last Admin   acetaminophen (TYLENOL) tablet 650 mg  650 mg Oral Q4H PRN Chotiner, Claudean Severance, MD       escitalopram (LEXAPRO) tablet 10 mg  10 mg Oral Daily Chotiner, Claudean Severance, MD   10 mg at 03/01/21 0917   ondansetron (ZOFRAN) injection 4 mg  4 mg Intravenous Q6H PRN Chotiner, Claudean Severance, MD       QUEtiapine (SEROQUEL) tablet 50 mg  50 mg Oral QHS Carollee Herter, DO    50 mg at 03/01/21 0002   traZODone (DESYREL) tablet 100 mg  100 mg Oral QHS PRN Carollee Herter, DO        Psychiatric Specialty Exam:  Presentation  General Appearance: Appropriate for Environment  Eye Contact:Good  Speech:Clear and Coherent  Speech Volume:Normal  Handedness:No data recorded  Mood and Affect  Mood:Anxious; Irritable  Affect:Congruent   Thought Process  Thought Processes:Coherent  Descriptions of Associations:Intact  Orientation:Full (Time, Place and Person)  Thought Content:Logical  History of Schizophrenia/Schizoaffective disorder:No  Duration of Psychotic Symptoms:No data recorded Hallucinations:Hallucinations: None  Ideas of Reference:None  Suicidal Thoughts:Suicidal Thoughts: No  Homicidal Thoughts:Homicidal Thoughts: No   Sensorium  Memory:Immediate Good; Recent Good; Remote Good  Judgment:Fair  Insight:Fair   Executive Functions  Concentration:Good  Attention Span:Good  Recall:Fair  Fund of Knowledge:Good  Language:Good   Psychomotor Activity  Psychomotor Activity:Psychomotor Activity: Normal   Assets  Assets:Communication Skills; Desire for Improvement; Housing; Resilience   Sleep  Sleep:Sleep: Good   Physical Exam: Physical Exam Constitutional:      Appearance: Normal appearance.  HENT:     Head: Normocephalic and atraumatic.  Eyes:     Extraocular Movements: Extraocular movements intact.  Pulmonary:     Effort: Pulmonary effort  is normal.  Skin:    General: Skin is dry.  Neurological:     Mental Status: He is alert and oriented to person, place, and time.   Review of Systems  Psychiatric/Behavioral:  Positive for substance abuse. Negative for hallucinations and suicidal ideas. The patient is nervous/anxious.   Blood pressure 137/79, pulse 71, temperature 98.5 F (36.9 C), temperature source Oral, resp. rate (!) 21, height  (1.778 m), weight 74.8 kg, SpO2 100 %. Body mass index is 23.66  kg/m.  Treatment Plan Summary: Mr. Areatha Keas is a 35 yo male who was transferred to [facility] to [facility] due to cardiac concerns. Patient has a known hx of Bipolar disorder. On assessment today patient is not endorsing SI and appears future oriented with decent judgement and insight into his situation. Patient has been consistent about being concerned regarding his mental health and reaching out for help. Patient has a known positive response to Seroquel and appeared to respond well overnight to a lower dose of the medication than previously prescribed. Seroquel is an appropriate medication that can be used for both stabilization and maintenance of Bipolar disorder. Patient has shown that should he become concerned about his behaviors or having SI he would take action to reach out. Safety planning was also done with patient and patient denied having access to firearms and having family members in the area (sister and another family member) whom he would reach out too if he began to feel bad. Patient also endorsed that he would return to [facility] if he needed. Patient also endorsed a plan to begin seeing another OP psychiatrist once his insurance kicks in. Patient overall endorsed multiple points (plans to find a new OP provider, thinking about bills being due and days of work missed, asking appropriate questions about medication at discharge)  suggesting that he is no longer having SI and also denied SI when asked. Patient also endorsed that despite his initial concern being SI, he had taking the time to think that he needed assistance with his mental help and allotted himself a few days to take off from work to attempt to resolve his concerns. It was also discussed with patient that it is highly recommended that he stay away from [facility] and illicit substances. It remains possible that patient SI may have been 2/2 to substance use. Patient endorsed understanding. Overall patient appears psychiatrically stable to return  home.  Recommendations Hx of Bipolar disorder - Seroquel  QHS - Sw consult for OP psychiatry options  Safety At this time patient appears to be of low risk of harm to himself or others. - D'c 1:1 sitter  Thank you for this consult. We will sign off at this time.  Disposition: Home   PGY-2 Bobbye Morton, MD 03/01/2021 11:43 AM

## 2021-09-09 ENCOUNTER — Encounter (HOSPITAL_COMMUNITY): Payer: Self-pay | Admitting: Student

## 2021-09-09 ENCOUNTER — Ambulatory Visit (HOSPITAL_COMMUNITY)
Admission: EM | Admit: 2021-09-09 | Discharge: 2021-09-10 | Disposition: A | Discharge status: Home / Self Care | Payer: No Payment, Other | Attending: Student | Admitting: Student

## 2021-09-09 DIAGNOSIS — Z79899 Other long term (current) drug therapy: Secondary | ICD-10-CM | POA: Insufficient documentation

## 2021-09-09 DIAGNOSIS — Z20822 Contact with and (suspected) exposure to covid-19: Secondary | ICD-10-CM | POA: Insufficient documentation

## 2021-09-09 DIAGNOSIS — F1410 Cocaine abuse, uncomplicated: Secondary | ICD-10-CM | POA: Insufficient documentation

## 2021-09-09 DIAGNOSIS — F19959 Other psychoactive substance use, unspecified with psychoactive substance-induced psychotic disorder, unspecified: Secondary | ICD-10-CM | POA: Insufficient documentation

## 2021-09-09 DIAGNOSIS — F22 Delusional disorders: Secondary | ICD-10-CM | POA: Insufficient documentation

## 2021-09-09 DIAGNOSIS — F19950 Other psychoactive substance use, unspecified with psychoactive substance-induced psychotic disorder with delusions: Secondary | ICD-10-CM | POA: Diagnosis present

## 2021-09-09 DIAGNOSIS — I252 Old myocardial infarction: Secondary | ICD-10-CM | POA: Insufficient documentation

## 2021-09-09 DIAGNOSIS — F1210 Cannabis abuse, uncomplicated: Secondary | ICD-10-CM | POA: Diagnosis present

## 2021-09-09 LAB — CBC WITH DIFFERENTIAL/PLATELET
Abs Immature Granulocytes: 0.04 10*3/uL (ref 0.00–0.07)
Basophils Absolute: 0 10*3/uL (ref 0.0–0.1)
Basophils Relative: 0 %
Eosinophils Absolute: 0 10*3/uL (ref 0.0–0.5)
Eosinophils Relative: 0 %
HCT: 43.3 % (ref 39.0–52.0)
Hemoglobin: 14.3 g/dL (ref 13.0–17.0)
Immature Granulocytes: 0 %
Lymphocytes Relative: 25 %
Lymphs Abs: 3.1 10*3/uL (ref 0.7–4.0)
MCH: 29.1 pg (ref 26.0–34.0)
MCHC: 33 g/dL (ref 30.0–36.0)
MCV: 88 fL (ref 80.0–100.0)
Monocytes Absolute: 0.9 10*3/uL (ref 0.1–1.0)
Monocytes Relative: 7 %
Neutro Abs: 8.6 10*3/uL — ABNORMAL HIGH (ref 1.7–7.7)
Neutrophils Relative %: 68 %
Platelets: 246 10*3/uL (ref 150–400)
RBC: 4.92 MIL/uL (ref 4.22–5.81)
RDW: 13.3 % (ref 11.5–15.5)
WBC: 12.7 10*3/uL — ABNORMAL HIGH (ref 4.0–10.5)
nRBC: 0 % (ref 0.0–0.2)

## 2021-09-09 LAB — COMPREHENSIVE METABOLIC PANEL
ALT: 41 U/L (ref 0–44)
AST: 48 U/L — ABNORMAL HIGH (ref 15–41)
Albumin: 4.2 g/dL (ref 3.5–5.0)
Alkaline Phosphatase: 42 U/L (ref 38–126)
Anion gap: 11 (ref 5–15)
BUN: 8 mg/dL (ref 6–20)
CO2: 24 mmol/L (ref 22–32)
Calcium: 9.3 mg/dL (ref 8.9–10.3)
Chloride: 104 mmol/L (ref 98–111)
Creatinine, Ser: 1.15 mg/dL (ref 0.61–1.24)
GFR, Estimated: >60 mL/min (ref >60)
Glucose, Bld: 76 mg/dL (ref 70–99)
Potassium: 3.8 mmol/L (ref 3.5–5.1)
Sodium: 139 mmol/L (ref 135–145)
Total Bilirubin: 0.9 mg/dL (ref 0.3–1.2)
Total Protein: 6.4 g/dL — ABNORMAL LOW (ref 6.5–8.1)

## 2021-09-09 LAB — LIPID PANEL
Cholesterol: 146 mg/dL (ref 0–200)
HDL: 73 mg/dL (ref >40)
LDL Cholesterol: 66 mg/dL (ref 0–99)
Total CHOL/HDL Ratio: 2 RATIO
Triglycerides: 33 mg/dL (ref <150)
VLDL: 7 mg/dL (ref 0–40)

## 2021-09-09 LAB — POCT URINE DRUG SCREEN - MANUAL ENTRY (I-SCREEN)
POC Amphetamine UR: NOT DETECTED
POC Buprenorphine (BUP): NOT DETECTED
POC Cocaine UR: POSITIVE — AB
POC Marijuana UR: POSITIVE — AB
POC Methadone UR: NOT DETECTED
POC Methamphetamine UR: POSITIVE — AB
POC Morphine: NOT DETECTED
POC Oxazepam (BZO): NOT DETECTED
POC Oxycodone UR: NOT DETECTED
POC Secobarbital (BAR): NOT DETECTED

## 2021-09-09 LAB — URINALYSIS, ROUTINE W REFLEX MICROSCOPIC
Bacteria, UA: NONE SEEN
Bilirubin Urine: NEGATIVE
Glucose, UA: NEGATIVE mg/dL
Ketones, ur: NEGATIVE mg/dL
Leukocytes,Ua: NEGATIVE
Nitrite: NEGATIVE
Protein, ur: NEGATIVE mg/dL
Specific Gravity, Urine: 1.002 — ABNORMAL LOW (ref 1.005–1.030)
pH: 7 (ref 5.0–8.0)

## 2021-09-09 LAB — RESP PANEL BY RT-PCR (FLU A&B, COVID) ARPGX2
Influenza A by PCR: NEGATIVE
Influenza B by PCR: NEGATIVE
SARS Coronavirus 2 by RT PCR: NEGATIVE

## 2021-09-09 LAB — HEMOGLOBIN A1C
Hgb A1c MFr Bld: 5.2 % (ref 4.8–5.6)
Mean Plasma Glucose: 102.54 mg/dL

## 2021-09-09 LAB — POC SARS CORONAVIRUS 2 AG: SARSCOV2ONAVIRUS 2 AG: NEGATIVE

## 2021-09-09 LAB — ETHANOL: Alcohol, Ethyl (B): <10 mg/dL (ref <10)

## 2021-09-09 LAB — TSH: TSH: 0.933 u[IU]/mL (ref 0.350–4.500)

## 2021-09-09 MED ORDER — ONDANSETRON 4 MG PO TBDP: 4.0000 mg | ORAL_TABLET | Freq: Four times a day (QID) | ORAL | Status: DC | PRN

## 2021-09-09 MED ORDER — LORAZEPAM 1 MG PO TABS: 1.0000 mg | ORAL_TABLET | Freq: Four times a day (QID) | ORAL | Status: DC | PRN

## 2021-09-09 MED ORDER — HYDROXYZINE HCL 25 MG PO TABS
25.0000 mg | ORAL_TABLET | Freq: Four times a day (QID) | ORAL | Status: DC | PRN
  Administered 2021-09-09: 25 mg via ORAL
  Filled 2021-09-09: qty 1

## 2021-09-09 MED ORDER — NICOTINE POLACRILEX 2 MG MT GUM: 2.0000 mg | CHEWING_GUM | OROMUCOSAL | Status: DC | PRN

## 2021-09-09 MED ORDER — ADULT MULTIVITAMIN W/MINERALS CH
1.0000 | ORAL_TABLET | Freq: Every day | ORAL | Status: DC
  Administered 2021-09-09 – 2021-09-10 (×2): 1 via ORAL
  Filled 2021-09-09 (×2): qty 1

## 2021-09-09 MED ORDER — MAGNESIUM HYDROXIDE 400 MG/5ML PO SUSP: 30.0000 mL | Freq: Every day | ORAL | Status: DC | PRN

## 2021-09-09 MED ORDER — LOPERAMIDE HCL 2 MG PO CAPS: 2.0000 mg | ORAL_CAPSULE | ORAL | Status: DC | PRN

## 2021-09-09 MED ORDER — ALUM & MAG HYDROXIDE-SIMETH 200-200-20 MG/5ML PO SUSP: 30.0000 mL | ORAL | Status: DC | PRN

## 2021-09-09 MED ORDER — THIAMINE HCL 100 MG PO TABS
100.0000 mg | ORAL_TABLET | Freq: Every day | ORAL | Status: DC
  Administered 2021-09-10: 100 mg via ORAL
  Filled 2021-09-09: qty 1

## 2021-09-09 MED ORDER — THIAMINE HCL 100 MG/ML IJ SOLN
100.0000 mg | Freq: Once | INTRAMUSCULAR | Status: AC
  Administered 2021-09-09: 100 mg via INTRAMUSCULAR
  Filled 2021-09-09: qty 2

## 2021-09-09 MED ORDER — NICOTINE 21 MG/24HR TD PT24: 21.0000 mg | MEDICATED_PATCH | Freq: Every day | TRANSDERMAL | Status: DC

## 2021-09-09 MED ORDER — ACETAMINOPHEN 325 MG PO TABS: 650.0000 mg | ORAL_TABLET | Freq: Four times a day (QID) | ORAL | Status: DC | PRN

## 2021-09-09 MED ORDER — OLANZAPINE 5 MG PO TBDP
5.0000 mg | ORAL_TABLET | Freq: Every day | ORAL | Status: DC
  Administered 2021-09-09: 5 mg via ORAL
  Filled 2021-09-09: qty 1

## 2021-09-09 NOTE — ED Notes (Signed)
Pt calm and cooperative. Pt has no thoughts of SI/HI at this time. Pt has sad affect. He wants to use phone it was explained to him how to dial out to get an outside line. Will continue to monitor for safety

## 2021-09-09 NOTE — Progress Notes (Signed)
   09/09/21 1434  [facility] Triage Screening (Walk-ins at [facility] only)  How Did You Hear About [place]? Self  What Is the Reason for Your Visit/Call Today? Pt is a 36 yo male who presented voluntarily and unaccompanied due to worsening ability to function at work and "around people."  Pt denied current SI, hx of suicide attempts, HI or any hx of violence, NSSH, AVH and paranoia. Pt did report some fear of being followed and harmed by others but stated he thought it mostly was connected to his drug use and related interactions. Pt did report some passive SI about a year ago without a plan. Pt stated he had never been psychiatrically admitted to a hospital. Pt denied any current OP psychiatric providers and stated he stopped taking prescribed medications about 5 months ago. Hx of Bipolar 1 and GAD. Pt reported regular substance use including cocaine, alcohol and cannabis. Pt reported weekly use of cocaine and cannabis on the weekends and daily use of alcohol with some withdrawal symptoms if he does not drink every day.  How Long Has This Been Causing You Problems? > than 6 months  Have You Recently Had Any Thoughts About Hurting Yourself? No  Are You Planning to Commit Suicide/Harm Yourself At This time? No  Have you Recently Had Thoughts About Hurting Someone [name]? No  Are You Planning To Harm Someone At This Time? No  Are you currently experiencing any auditory, visual or other hallucinations? No  Have You Used Any Alcohol or Drugs in the Past 24 Hours? Yes  How long ago did you use Drugs or Alcohol? yesterday 09/08/21  What Did You Use and How Much? unknown  Do you have any current medical co-morbidities that require immediate attention? No  Clinician description of patient physical appearance/behavior: Pt was calm, cooperative, alert and appeared oriented. Pt did not appear to be responding to internal stimuli, experiencing delusional thinking or to be intoxicated.  Pt's speech and movement appeared within normal  limits and appearance was unremarkable. Pt's mood seemed solemn but not depressed, and pt had a flat affect which was congruent. Pt's judgment and insight seemed within normal limits.  What Do You Feel Would Help You the Most Today? Alcohol or Drug Use Treatment;Treatment for Depression or other mood problem  Determination of Need Routine (7 days)  Options For Referral Chemical Dependency Intensive Outpatient Therapy (CDIOP)   Kyle Sweeney T. Jimmye Norman, MS, [facility], [facility] Triage Specialist [facility]

## 2021-09-09 NOTE — ED Notes (Signed)
Pt sleeping@this time. Breathing even and unlabored. Will continue to monitor for safety 

## 2021-09-09 NOTE — ED Provider Notes (Incomplete)
Kyle Sweeney is a 36 y.o. male presented to [facility] voluntarily accompanied by family for substance abuse and paranoia, interested in [facility]. PMH: BP1 v substance induced mania, GAD, multiple SI, cocaine use d/o, cannabis use d/o, tobacco use d/o, NSTEMI

## 2021-09-09 NOTE — ED Notes (Signed)
[facility] courier called to collect STAT specimens and to deliver to [facility]. 
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
## 2021-09-09 NOTE — Progress Notes (Signed)
Received Kyle Sweeney on the OBS unit, he was oriented to his new environment and made comfortable. He ate dinner and received his medications. He endorsed feeling a little depressed at this time.

## 2021-09-10 LAB — RPR: RPR Ser Ql: NONREACTIVE

## 2021-09-10 MED ORDER — THIAMINE HCL 100 MG PO TABS: 100.0000 mg | ORAL_TABLET | Freq: Every day | ORAL | Status: AC

## 2021-09-10 MED ORDER — ONE-A-DAY MENS PO TABS: 1.0000 | ORAL_TABLET | Freq: Every day | ORAL | Status: AC

## 2021-09-10 MED ORDER — OLANZAPINE 5 MG PO TABS: 5.0000 mg | ORAL_TABLET | Freq: Every day | ORAL | 0 refills | Status: AC

## 2021-09-10 NOTE — ED Notes (Signed)
Pt is lying in bed quietly no distress noted, will continue to monitor patient for safety. 

## 2021-09-10 NOTE — ED Notes (Addendum)
Pt was given a muffin and juice.  

## 2021-09-10 NOTE — ED Notes (Signed)
Pt was given a sandwich and juice for lunch.  

## 2021-09-10 NOTE — ED Notes (Signed)
Pt has taken his morning medications. PT is lying in bed quietly, no distress noted will continue to monitor patient for safety

## 2021-09-10 NOTE — Progress Notes (Signed)
Kyle Sweeney received his AVS, questions answered and he retrieved his personal belongings. He was escorted to the lobby without incident.
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
## 2021-09-10 NOTE — ED Notes (Signed)
Pt sleeping@this time. Breathing even and unlabored. Will continue to monitor for safety 

## 2021-11-29 ENCOUNTER — Telehealth (HOSPITAL_COMMUNITY): Payer: Self-pay | Admitting: Student

## 2022-06-27 IMAGING — CT CT HEART MORP W/ CTA COR W/ SCORE W/ CA W/CM &/OR W/O CM
4 of 7 series · 8 of 20 positions shown, 9 images · IV contrast (omnipaque)
Comparison: None.
COMPARISON: None.

Addendum:
EXAM:
OVER-READ INTERPRETATION  CT CHEST

The following report is an over-read performed by radiologist Dr.
Munoda Peachy [REDACTED] on 02/28/2021. This
over-read does not include interpretation of cardiac or coronary
anatomy or pathology. The coronary calcium score/coronary CTA
interpretation by the cardiologist is attached.
HISTORY: Chest pain/anginal equiv, ECGs or troponins abnormal
Cardiac/Coronary  CT
TECHNIQUE: The patient was scanned on a Siemens Force scanner.
PROTOCOL: A 120 kV prospective scan was triggered in the descending thoracic
aorta at 111 HU's. Axial non-contrast 3 mm slices were carried out
through the heart. The data set was analyzed on a dedicated work
station and scored using the Agatston method. Gantry rotation speed
was 250 msecs and collimation was .6 mm. Beta blockade and 0.8 mg of
sl NTG was given. The 3D data set was reconstructed in 5% intervals
of the 35-75 % of the R-R cycle. Systolic and diastolic phases were
analyzed on a dedicated work station using MPR, MIP and VRT modes.
The patient received 95mL OMNIPAQUE IOHEXOL 350 MG/ML SOLN contrast.

[Series 6: best diast 73 % · axial · 0.39mm/px · z∈[+1017,+1062]mm · 2 of 336 slices shown]
[im 112/336  vessel]
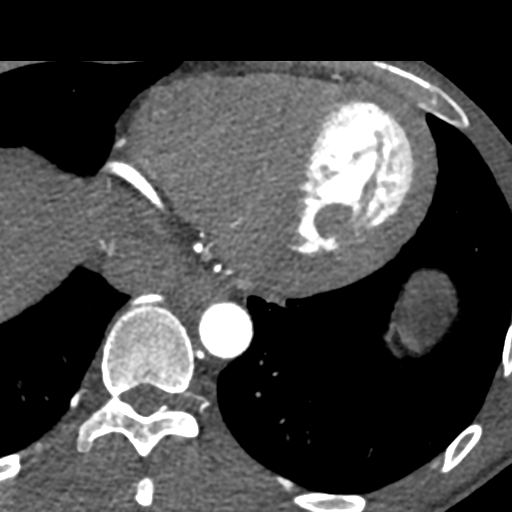
[im 224/336  vessel]
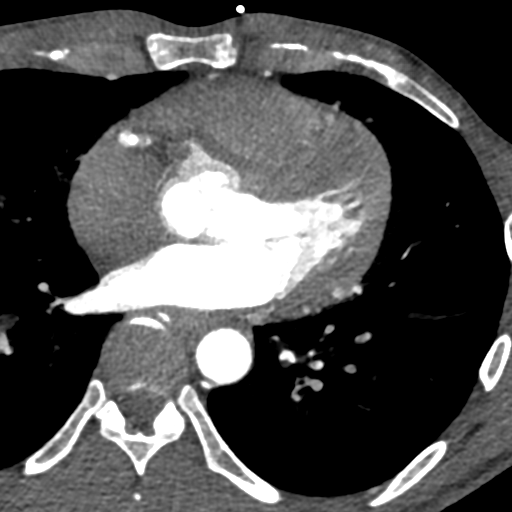

[Series 7: best syst · axial · 0.39mm/px · z∈[+1017,+1062]mm · 2 of 336 slices shown, 3 images]
[im 112/336  vessel]
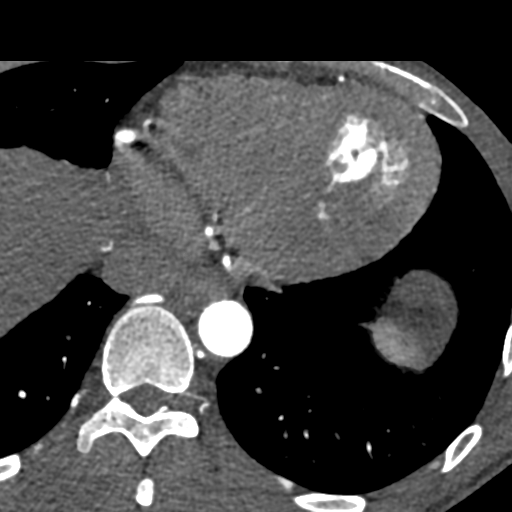
[im 112/336  lung]
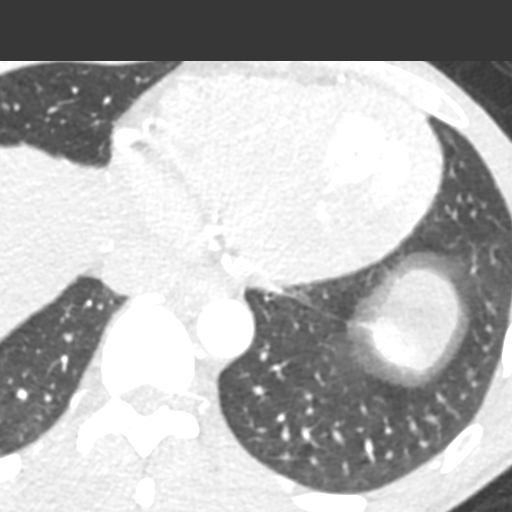
[im 224/336  vessel]
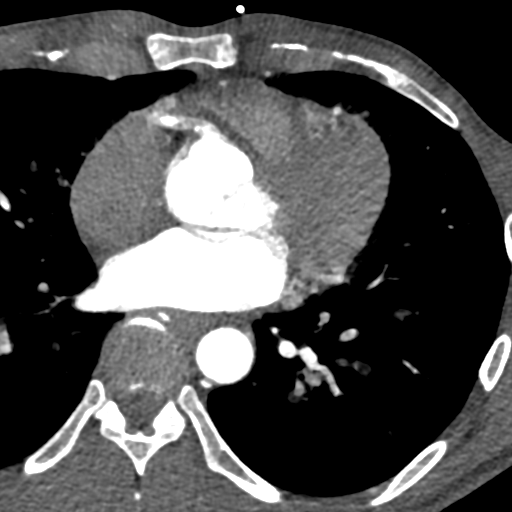

[Series 8: ts diast sharp · axial · 0.39mm/px · z∈[+1017,+1062]mm · 2 of 336 slices shown]
[im 112/336  lung]
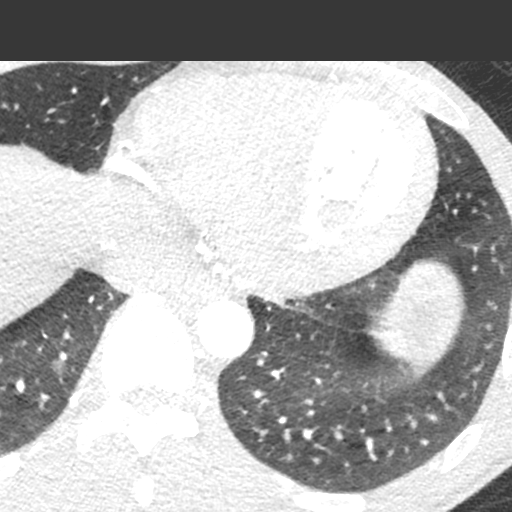
[im 224/336  lung]
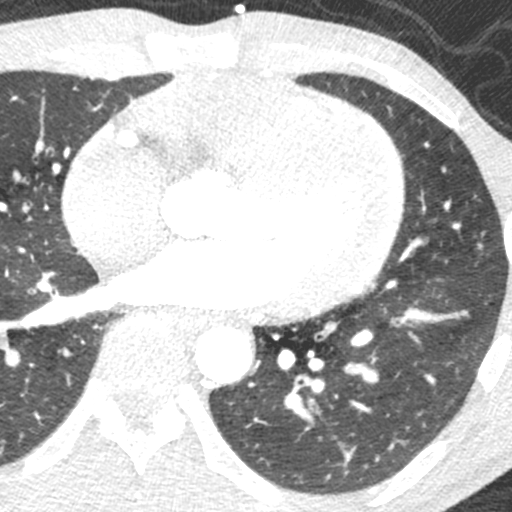

[Series 9: ts syst sharp · axial · 0.39mm/px · z∈[+1017,+1062]mm · 2 of 336 slices shown]
[im 112/336  lung]
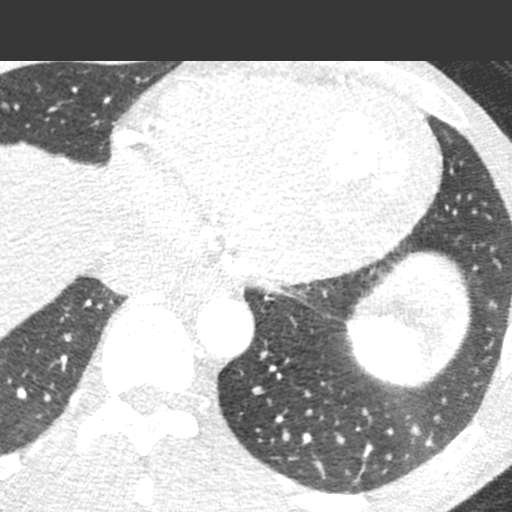
[im 224/336  lung]
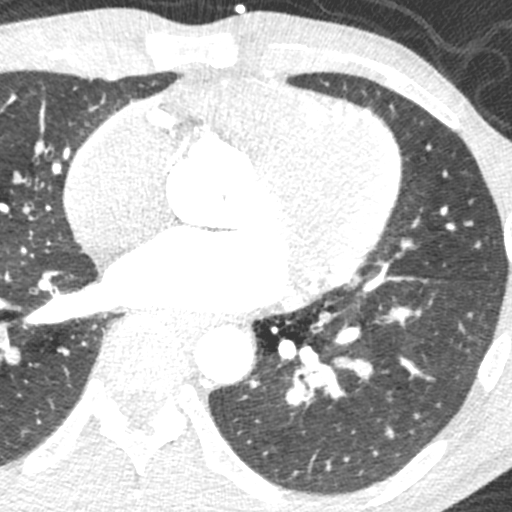

[8 of 20 positions shown; findings below may reference images not displayed]

FINDINGS: Within the visualized portions of the thorax there are no suspicious
appearing pulmonary nodules or masses, there is no acute
consolidative airspace disease, no pleural effusions, no
pneumothorax and no lymphadenopathy. Visualized portions of the
upper abdomen are unremarkable. There are no aggressive appearing
lytic or blastic lesions noted in the visualized portions of the
skeleton.
IMPRESSION: 1. No significant incidental noncardiac findings are noted.
FINDINGS: Image quality: Good

Noise artifact is: Limited

Coronary calcium score is 0.

Coronary arteries: Normal coronary origins.  Right dominance.

Right Coronary Artery: No detectable plaque or stenosis.

Left Main Coronary Artery: No detectable plaque or stenosis.

Left Anterior Descending Coronary Artery: No detectable plaque or
stenosis.

Left Circumflex Artery: No detectable plaque or stenosis.

Aorta: Normal size, 33 mm at the mid ascending aorta (level of the
PA bifurcation) measured double oblique. No calcifications. No
dissection.

Aortic Valve: No calcifications.

Other findings:

Normal pulmonary vein drainage into the left atrium.

Normal left atrial appendage without thrombus. Small accessory
appendage without thrombus.

Normal size of the pulmonary artery.
IMPRESSION: 1. No evidence of CAD, CADRADS = 0.

2. Coronary calcium score of 0.

3. Normal coronary origins with right dominance.

*** End of Addendum ***
EXAM:
OVER-READ INTERPRETATION  CT CHEST

The following report is an over-read performed by radiologist Dr.
Munoda Peachy [REDACTED] on 02/28/2021. This
over-read does not include interpretation of cardiac or coronary
anatomy or pathology. The coronary calcium score/coronary CTA
interpretation by the cardiologist is attached.
FINDINGS: Within the visualized portions of the thorax there are no suspicious
appearing pulmonary nodules or masses, there is no acute
consolidative airspace disease, no pleural effusions, no
pneumothorax and no lymphadenopathy. Visualized portions of the
upper abdomen are unremarkable. There are no aggressive appearing
lytic or blastic lesions noted in the visualized portions of the
skeleton.
IMPRESSION: 1. No significant incidental noncardiac findings are noted.
